# Patient Record
Sex: Male | Born: 1940 | Race: White | Hispanic: No | Marital: Married | Smoking: Never smoker
Health system: Southern US, Community
[De-identification: ages and names within clinical notes are randomized; demographics above are authoritative.]

## PROBLEM LIST (undated history)

## (undated) DIAGNOSIS — Z87442 Personal history of urinary calculi: Secondary | ICD-10-CM

## (undated) DIAGNOSIS — I10 Essential (primary) hypertension: Secondary | ICD-10-CM

## (undated) DIAGNOSIS — R011 Cardiac murmur, unspecified: Secondary | ICD-10-CM

## (undated) DIAGNOSIS — C801 Malignant (primary) neoplasm, unspecified: Secondary | ICD-10-CM

## (undated) DIAGNOSIS — E7800 Pure hypercholesterolemia, unspecified: Secondary | ICD-10-CM

## (undated) DIAGNOSIS — B029 Zoster without complications: Secondary | ICD-10-CM

## (undated) DIAGNOSIS — G40909 Epilepsy, unspecified, not intractable, without status epilepticus: Secondary | ICD-10-CM

## (undated) DIAGNOSIS — M1990 Unspecified osteoarthritis, unspecified site: Secondary | ICD-10-CM

## (undated) DIAGNOSIS — R251 Tremor, unspecified: Secondary | ICD-10-CM

## (undated) HISTORY — PX: FRACTURE SURGERY: SHX138

## (undated) HISTORY — PX: TONSILLECTOMY: SUR1361

## (undated) HISTORY — DX: Essential (primary) hypertension: I10

## (undated) HISTORY — PX: HEEL SPUR SURGERY: SHX665

## (undated) HISTORY — PX: HARDWARE REMOVAL: SHX979

---

## 2013-04-25 DIAGNOSIS — C61 Malignant neoplasm of prostate: Secondary | ICD-10-CM | POA: Insufficient documentation

## 2015-02-23 DIAGNOSIS — E785 Hyperlipidemia, unspecified: Secondary | ICD-10-CM | POA: Insufficient documentation

## 2015-02-23 DIAGNOSIS — R9439 Abnormal result of other cardiovascular function study: Secondary | ICD-10-CM | POA: Insufficient documentation

## 2015-02-23 DIAGNOSIS — I10 Essential (primary) hypertension: Secondary | ICD-10-CM | POA: Insufficient documentation

## 2016-08-30 ENCOUNTER — Encounter: Payer: Self-pay | Admitting: Cardiovascular Disease

## 2017-01-29 DIAGNOSIS — K8020 Calculus of gallbladder without cholecystitis without obstruction: Secondary | ICD-10-CM | POA: Insufficient documentation

## 2020-12-23 ENCOUNTER — Emergency Department
Admission: EM | Admit: 2020-12-23 | Discharge: 2020-12-23 | Disposition: A | Discharge status: Home / Self Care | Payer: Medicare HMO | Attending: Emergency Medicine | Admitting: Emergency Medicine

## 2020-12-23 ENCOUNTER — Other Ambulatory Visit: Payer: Self-pay

## 2020-12-23 DIAGNOSIS — R1031 Right lower quadrant pain: Secondary | ICD-10-CM

## 2020-12-23 DIAGNOSIS — Z8546 Personal history of malignant neoplasm of prostate: Secondary | ICD-10-CM | POA: Insufficient documentation

## 2020-12-23 DIAGNOSIS — K8020 Calculus of gallbladder without cholecystitis without obstruction: Secondary | ICD-10-CM | POA: Diagnosis not present

## 2020-12-23 HISTORY — DX: Unspecified osteoarthritis, unspecified site: M19.90

## 2020-12-23 HISTORY — DX: Malignant (primary) neoplasm, unspecified: C80.1

## 2020-12-23 LAB — CBC
HCT: 38.4 % — ABNORMAL LOW (ref 39.0–52.0)
Hemoglobin: 13.2 g/dL (ref 13.0–17.0)
MCH: 30.8 pg (ref 26.0–34.0)
MCHC: 34.4 g/dL (ref 30.0–36.0)
MCV: 89.5 fL (ref 80.0–100.0)
Platelets: 195 10*3/uL (ref 150–400)
RBC: 4.29 MIL/uL (ref 4.22–5.81)
RDW: 12.7 % (ref 11.5–15.5)
WBC: 7.7 10*3/uL (ref 4.0–10.5)
nRBC: 0 % (ref 0.0–0.2)

## 2020-12-23 LAB — COMPREHENSIVE METABOLIC PANEL
ALT: 9 U/L (ref 0–44)
AST: 16 U/L (ref 15–41)
Albumin: 3.9 g/dL (ref 3.5–5.0)
Alkaline Phosphatase: 81 U/L (ref 38–126)
Anion gap: 9 (ref 5–15)
BUN: 23 mg/dL (ref 8–23)
CO2: 25 mmol/L (ref 22–32)
Calcium: 9 mg/dL (ref 8.9–10.3)
Chloride: 102 mmol/L (ref 98–111)
Creatinine, Ser: 1.16 mg/dL (ref 0.61–1.24)
GFR, Estimated: >60 mL/min (ref >60)
Glucose, Bld: 104 mg/dL — ABNORMAL HIGH (ref 70–99)
Potassium: 3.8 mmol/L (ref 3.5–5.1)
Sodium: 136 mmol/L (ref 135–145)
Total Bilirubin: 1.9 mg/dL — ABNORMAL HIGH (ref 0.3–1.2)
Total Protein: 7.9 g/dL (ref 6.5–8.1)

## 2020-12-23 LAB — URINALYSIS, COMPLETE (UACMP) WITH MICROSCOPIC
Bacteria, UA: NONE SEEN
Bilirubin Urine: NEGATIVE
Glucose, UA: NEGATIVE mg/dL
Hgb urine dipstick: NEGATIVE
Ketones, ur: 5 mg/dL — AB
Leukocytes,Ua: NEGATIVE
Nitrite: NEGATIVE
Protein, ur: 30 mg/dL — AB
Specific Gravity, Urine: 1.026 (ref 1.005–1.030)
pH: 5 (ref 5.0–8.0)

## 2020-12-23 LAB — LIPASE, BLOOD: Lipase: 25 U/L (ref 11–51)

## 2020-12-23 NOTE — ED Notes (Signed)
Urinal provided. Patient verbalized understanding of urine specimen ordered.

## 2020-12-23 NOTE — ED Notes (Signed)
Patient ambulatory to restroom with steady gait.

## 2020-12-23 NOTE — ED Triage Notes (Addendum)
Pt to ER with complaints of abdominal pain. Reports waking up on Tuesday morning around 2am with epigastric pain, nausea and diarrhea. On Wednesday pain started to subside. Now is having a dull- discomfort in his RLQ. Pt was previously diagnosed with gall stones. Pt still has appendix. Nothing makes pain better or worse.   Denies urinary symptoms.   Pt reports he hasn't eaten anything since Monday night. Has been able to tolerate ginger ale.

## 2020-12-23 NOTE — ED Notes (Signed)
Patient is resting comfortably. 

## 2020-12-23 NOTE — ED Notes (Signed)
Patient is resting comfortably. Patient denies needs at this time. Pillow and blanket offered, patient declined.

## 2020-12-23 NOTE — ED Notes (Signed)
ED Provider at bedside. 

## 2020-12-23 NOTE — ED Notes (Signed)
Patient reports dx of gallstones 4 years ago, and reports his urology who dx him stated that as long as they weren't bothering him, they didn't need to treat them. Patient reports nausea, vomiting, and diarrhea since Tuesday, and reports the RLQ pain has worsened today, causing him to come be seen. Patient reports dull ache. Patient reports last emesis was Tuesday, but diarrhea has been frequent today.

## 2020-12-23 NOTE — ED Notes (Signed)
Patient and wife provided discharge instructions, including follow up care. Patient denies needs, questions, or concerns.

## 2020-12-23 NOTE — ED Provider Notes (Signed)
[facility] Emergency Department Provider Note   ____________________________________________   Event Date/Time   First MD Initiated Contact with Patient 12/23/20 1638     (approximate)  I have reviewed the triage vital signs and the nursing notes.   HISTORY  Chief Complaint Abdominal Pain    HPI Curtis Quinn is a 80 y.o. male With the below stated past medical history who presents for right lower quadrant abdominal pain after 3 days of nausea/vomiting/diarrhea and epigastric pain.  Patient states that the symptoms resolved 1 day prior to arrival however he then developed right lower quadrant "discomfort".  Patient describes a dull, aching, 3/10, nonradiating right lower quadrant abdominal pain that is not worse with walking and not associated with any nausea/vomiting/diarrhea.  Patient states that he does have a history of gallstones as well as still has his appendix.  Patient currently denies any vision changes, tinnitus, difficulty speaking, facial droop, sore throat, chest pain, shortness of breath, dysuria, or weakness/numbness/paresthesias in any extremity         Past Medical History:  Diagnosis Date  . Arthritis   . Cancer [facility])    prostate    There are no problems to display for this patient.   Past Surgical History:  Procedure Laterality Date  . FRACTURE SURGERY    . TONSILLECTOMY      Prior to Admission medications   Medication Sig Start Date End Date Taking? Authorizing Provider  simvastatin (ZOCOR) 20 MG tablet Take by mouth.    [provider]    Allergies Penicillins  No family history on file.  Social History Social History   Tobacco Use  . Smoking status: Never Smoker  . Smokeless tobacco: Never Used  Substance Use Topics  . Alcohol use: Yes  . Drug use: Never    Review of Systems Constitutional: No fever/chills Eyes: No visual changes. ENT: No sore throat. Cardiovascular: Denies chest  pain. Respiratory: Denies shortness of breath. Gastrointestinal: Endorses right lower quadrant abdominal pain.  No nausea, no vomiting.  No diarrhea. Genitourinary: Negative for dysuria. Musculoskeletal: Negative for acute arthralgias Skin: Negative for rash. Neurological: Negative for headaches, weakness/numbness/paresthesias in any extremity Psychiatric: Negative for suicidal ideation/homicidal ideation   ____________________________________________   PHYSICAL EXAM:  VITAL SIGNS: ED Triage Vitals  Enc Vitals Group     BP 12/23/20 1343 (!) 121/57     Pulse Rate 12/23/20 1343 93     Resp 12/23/20 1343 17     Temp 12/23/20 1343 98.6 F (37 C)     Temp Source 12/23/20 1343 Oral     SpO2 12/23/20 1343 98 %     Weight 12/23/20 1403 180 lb (81.6 kg)     Height 12/23/20 1403 6\' 1"  (1.854 m)     Head Circumference --      Peak Flow --      Pain Score 12/23/20 1402 3     Pain Loc --      Pain Edu? --      Excl. in [place]? --    Constitutional: Alert and oriented. Well appearing and in no acute distress. Eyes: Conjunctivae are normal. PERRL. Head: Atraumatic. Nose: No congestion/rhinnorhea. Mouth/Throat: Mucous membranes are moist. Neck: No stridor Cardiovascular: Grossly normal heart sounds.  Good peripheral circulation. Respiratory: Normal respiratory effort.  No retractions. Gastrointestinal: Soft and mild right lower quadrant tenderness to palpation without Rovsing sign. No distention. Musculoskeletal: No obvious deformities Neurologic:  Normal speech and language. No gross focal neurologic deficits  are appreciated. Skin:  Skin is warm and dry. No rash noted. Psychiatric: Mood and affect are normal. Speech and behavior are normal.  ____________________________________________   LABS (all labs ordered are listed, but only abnormal results are displayed)  Labs Reviewed  COMPREHENSIVE METABOLIC PANEL - Abnormal; Notable for the following components:      Result Value    Glucose, Bld 104 (*)    Total Bilirubin 1.9 (*)    All other components within normal limits  CBC - Abnormal; Notable for the following components:   HCT 38.4 (*)    All other components within normal limits  LIPASE, BLOOD  URINALYSIS, COMPLETE (UACMP) WITH MICROSCOPIC   ____________________________________________  EKG  ED ECG REPORT I, Naaman Plummer, the attending physician, personally viewed and interpreted this ECG.  Date: 12/23/2020 EKG Time: 1412 Rate: 93 Rhythm: normal sinus rhythm QRS Axis: normal Intervals: normal ST/T Wave abnormalities: normal Narrative Interpretation: no evidence of acute ischemia  PROCEDURES  Procedure(s) performed (including Critical Care):  .1-3 Lead EKG Interpretation Performed by: Naaman Plummer, MD Authorized by: Naaman Plummer, MD     Interpretation: normal     ECG rate:  85   ECG rate assessment: normal     Rhythm: sinus rhythm     Ectopy: none     Conduction: normal       ____________________________________________   INITIAL IMPRESSION / ASSESSMENT AND PLAN / ED COURSE  As part of my medical decision making, I reviewed the following data within the [place] notes reviewed and incorporated, Labs reviewed, EKG interpreted, Old chart reviewed, Radiograph reviewed and Notes from prior ED visits reviewed and incorporated        Patient presents for abdominal pain.  Differential diagnosis includes appendicitis, abdominal aortic aneurysm, surgical biliary disease, pancreatitis, SBO, mesenteric ischemia, serious intra-abdominal bacterial illness, genital torsion. Doubt atypical ACS. Based on history, physical exam, radiologic/laboratory evaluation, there is no red flag results or symptomatology requiring emergent intervention or need for admission at this time Pt tolerating PO. Disposition: Patient will be discharged with strict return precautions and follow up with primary MD within 12-24 hours for  further evaluation. Patient understands that this still may have an early presentation of an emergent medical condition such as appendicitis that will require a recheck.      ____________________________________________   FINAL CLINICAL IMPRESSION(S) / ED DIAGNOSES  Final diagnoses:  Right lower quadrant abdominal pain  Calculus of gallbladder without cholecystitis without obstruction     ED Discharge Orders    None       Note:  This document was prepared using Dragon voice recognition software and may include unintentional dictation errors.   Naaman Plummer, MD 12/23/20 [phone]

## 2021-01-03 ENCOUNTER — Other Ambulatory Visit: Payer: Self-pay

## 2021-01-03 ENCOUNTER — Encounter: Payer: Self-pay | Admitting: Surgery

## 2021-01-03 ENCOUNTER — Other Ambulatory Visit
Admission: RE | Admit: 2021-01-03 | Discharge: 2021-01-03 | Disposition: A | Discharge status: Home / Self Care | Payer: Medicare HMO | Source: Ambulatory Visit | Attending: Surgery | Admitting: Surgery

## 2021-01-03 ENCOUNTER — Ambulatory Visit: Payer: Medicare HMO | Admitting: Surgery

## 2021-01-03 VITALS — BP 141/69 | HR 81 | Temp 98.2°F | Ht 73.0 in | Wt 179.2 lb

## 2021-01-03 DIAGNOSIS — Z711 Person with feared health complaint in whom no diagnosis is made: Secondary | ICD-10-CM

## 2021-01-03 DIAGNOSIS — Z8546 Personal history of malignant neoplasm of prostate: Secondary | ICD-10-CM | POA: Insufficient documentation

## 2021-01-03 DIAGNOSIS — K8050 Calculus of bile duct without cholangitis or cholecystitis without obstruction: Secondary | ICD-10-CM | POA: Insufficient documentation

## 2021-01-03 LAB — COMPREHENSIVE METABOLIC PANEL
ALT: 12 U/L (ref 0–44)
AST: 19 U/L (ref 15–41)
Albumin: 3.9 g/dL (ref 3.5–5.0)
Alkaline Phosphatase: 70 U/L (ref 38–126)
Anion gap: 5 (ref 5–15)
BUN: 24 mg/dL — ABNORMAL HIGH (ref 8–23)
CO2: 26 mmol/L (ref 22–32)
Calcium: 9 mg/dL (ref 8.9–10.3)
Chloride: 106 mmol/L (ref 98–111)
Creatinine, Ser: 0.95 mg/dL (ref 0.61–1.24)
GFR, Estimated: >60 mL/min (ref >60)
Glucose, Bld: 106 mg/dL — ABNORMAL HIGH (ref 70–99)
Potassium: 3.8 mmol/L (ref 3.5–5.1)
Sodium: 137 mmol/L (ref 135–145)
Total Bilirubin: 0.9 mg/dL (ref 0.3–1.2)
Total Protein: 7.6 g/dL (ref 6.5–8.1)

## 2021-01-03 LAB — CBC
HCT: 33.7 % — ABNORMAL LOW (ref 39.0–52.0)
Hemoglobin: 11.6 g/dL — ABNORMAL LOW (ref 13.0–17.0)
MCH: 30.6 pg (ref 26.0–34.0)
MCHC: 34.4 g/dL (ref 30.0–36.0)
MCV: 88.9 fL (ref 80.0–100.0)
Platelets: 257 10*3/uL (ref 150–400)
RBC: 3.79 MIL/uL — ABNORMAL LOW (ref 4.22–5.81)
RDW: 12.3 % (ref 11.5–15.5)
WBC: 5.3 10*3/uL (ref 4.0–10.5)
nRBC: 0 % (ref 0.0–0.2)

## 2021-01-03 LAB — PSA: Prostatic Specific Antigen: 1.67 ng/mL (ref 0.00–4.00)

## 2021-01-03 LAB — PROTIME-INR
INR: 1.2 (ref 0.8–1.2)
Prothrombin Time: 15 seconds (ref 11.4–15.2)

## 2021-01-03 NOTE — Patient Instructions (Addendum)
Your Ultrasound is scheduled for January 04, 2021. Arrive at 7:15am at [facility]. Nothing to eat or drink after midnight tonight.   If you have any concerns or questions, please feel free to call our office. See follow up appointment below.

## 2021-01-03 NOTE — Consult Note (Signed)
Surgical Consultation  01/03/2021  Curtis Quinn is an 80 y.o. male.   Chief Complaint  Patient presents with  . New Patient (Initial Visit)    Gallbladder     HPI: 79 year old male seen in consultation at the request of Dr. Caryl Ada.  Approximately 2 weeks emergency room came in with an episode of right upper quadrant pain that radiated to the epigastric and to the back.,  Pain was moderate and lasted for few hours.  The morning he had a big breakfast at [employer].  He denies any fevers and chills no biliary obstruction.  He states that at some point time he was told that he had gallstones when he was due for his routine CT scan.  There was no evidence performed at the ER.  He did have however a CBC and a CMP that was unremarkable except a slightly elevated bilirubin. He recently moved here to [facility]  Before he was in [place] and [place].  He does have a history of prostate cancer and received cryoablation.  He is not too keen on doctors.  He seems to be able to perform more than 4 METS of activity and denies any chest pains or any heart attacks.  His EKG at that time showed no evidence of ischemia, NSR .  Please note that I have personally reviewed the EKG. No prior abdominal operations. 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 Arthritis   . Cancer [facility])    prostate    Past Surgical History:  Procedure Laterality Date  . FRACTURE SURGERY    . TONSILLECTOMY      History reviewed. No pertinent family history.  Social History:  reports that he has never smoked. He has never used smokeless tobacco. He reports current alcohol use. He reports that he does not use drugs.  Allergies:  Allergies  Allergen Reactions  . Penicillins Other (See Comments)    Hallucinations    Medications reviewed.     ROS Full ROS performed and is otherwise negative other than what is stated in the HPI    BP  (!) 141/69   Pulse 81   Temp 98.2 F (36.8 C) (Oral)   Ht 6\' 1"  (1.854 m)   Wt 179 lb 3.2 oz (81.3 kg)   SpO2 96%   BMI 23.64 kg/m   Physical Exam Vitals and nursing note reviewed. Exam conducted with a chaperone present.  Constitutional:      General: He is not in acute distress.    Appearance: Normal appearance. He is normal weight.  Eyes:     General:        Right eye: No discharge.        Left eye: No discharge.  Neck:     Vascular: No carotid bruit.  Cardiovascular:     Rate and Rhythm: Normal rate and regular rhythm.  Pulmonary:     Effort: Pulmonary effort is normal. No respiratory distress.     Breath sounds: Normal breath sounds. No stridor.  Abdominal:     General: Abdomen is flat. There is no distension.     Palpations: Abdomen is soft. There is no mass.     Tenderness: There is no guarding.     Comments: No peritonitis  Musculoskeletal:     Cervical back: Normal range of motion and neck supple. No rigidity or tenderness.  Lymphadenopathy:     Cervical: No cervical  adenopathy.  Skin:    General: Skin is warm and dry.     Capillary Refill: Capillary refill takes less than 2 seconds.  Neurological:     General: No focal deficit present.     Mental Status: He is alert and oriented to person, place, and time.  Psychiatric:        Mood and Affect: Mood normal.        Behavior: Behavior normal.        Thought Content: Thought content normal.        Judgment: Judgment normal.        Assessment/Plan: This is 80 year old male with presumed biliary colic.  We will confirm diagnosis with ultrasound.  We will also follow-up with a repeat CBC CMP as well as a INR. His symptoms are classic for biliary disease. 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  This is accompanied with Presbyphonia. 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  We will respect  his wishes. I Do think that he will eventually require cholecystectomy.  He specifically asked me about my outcomes was very candid with my response.  I do think that he will be a good candidate for robotic approach I discussed the procedure in detail.  The patient was given Neurosurgeon.  We discussed the risks and benefits of a laparoscopic cholecystectomy and possible cholangiogram including, but not limited to bleeding, infection, injury to surrounding structures such as the intestine or liver, bile leak, retained gallstones, need to convert to an open procedure, prolonged diarrhea, blood clots such as  DVT, common bile duct injury, anesthesia risks, and possible need for additional procedures.  The likelihood of improvement in symptoms and return to the patient's normal status is good. We discussed the typical post-operative recovery course. 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 Please note that I spent over 65 minutes in this encounter including extensive counseling, coordination of his care and extensively reviewing remote records from different institutions    Caroleen Hamman, MD [place] Surgeon

## 2021-01-04 ENCOUNTER — Ambulatory Visit
Admission: RE | Admit: 2021-01-04 | Discharge: 2021-01-04 | Disposition: A | Discharge status: Home / Self Care | Payer: Medicare HMO | Source: Ambulatory Visit | Attending: Surgery | Admitting: Surgery

## 2021-01-04 ENCOUNTER — Other Ambulatory Visit: Payer: Self-pay

## 2021-01-04 ENCOUNTER — Telehealth: Payer: Self-pay | Admitting: Surgery

## 2021-01-04 DIAGNOSIS — Z711 Person with feared health complaint in whom no diagnosis is made: Secondary | ICD-10-CM | POA: Insufficient documentation

## 2021-01-04 DIAGNOSIS — R251 Tremor, unspecified: Secondary | ICD-10-CM | POA: Insufficient documentation

## 2021-01-04 NOTE — Telephone Encounter (Signed)
Patient calls back, he is informed of all dates regarding his surgery and verbalized understanding. 

## 2021-01-04 NOTE — Telephone Encounter (Signed)
Outgoing call is made, left message for patient to call.  Please advise patient of Pre-Admission date/time, COVID Testing date and Surgery date.  Surgery Date: 01/20/21 Preadmission Testing Date: 01/13/21 (phone 8a-1p) Covid Testing Date: Not needed.    Also patient to call at [phone], between 1-3:00pm the day before surgery, to find out what time to arrive for surgery.

## 2021-01-05 ENCOUNTER — Ambulatory Visit: Payer: Medicare HMO | Admitting: Surgery

## 2021-01-05 ENCOUNTER — Other Ambulatory Visit: Payer: Self-pay | Admitting: Family Medicine

## 2021-01-05 ENCOUNTER — Encounter: Payer: Self-pay | Admitting: Surgery

## 2021-01-05 VITALS — BP 131/67 | HR 63 | Temp 98.4°F | Ht 73.0 in | Wt 177.8 lb

## 2021-01-05 DIAGNOSIS — K8020 Calculus of gallbladder without cholecystitis without obstruction: Secondary | ICD-10-CM | POA: Diagnosis not present

## 2021-01-05 DIAGNOSIS — N6019 Diffuse cystic mastopathy of unspecified breast: Secondary | ICD-10-CM

## 2021-01-05 NOTE — Patient Instructions (Signed)
You have requested to have your gallbladder removed. This will be done on 01/20/21 at [facility] with Dr. Dahlia Byes.  You will most likely be out of work 1-2 weeks for this surgery. You will return after your post-op appointment with a lifting restriction for approximately 4 more weeks.  You will be able to eat anything you would like to following surgery. But, start by eating a bland diet and advance this as tolerated. The Gallbladder diet is below, please go as closely by this diet as possible prior to surgery to avoid any further attacks.  Please see the (blue)pre-care form that you have been given today. If you have any questions, please call our office.  Laparoscopic Cholecystectomy Laparoscopic cholecystectomy is surgery to remove the gallbladder. The gallbladder is located in the upper right part of the abdomen, behind the liver. It is a storage sac for bile, which is produced in the liver. Bile aids in the digestion and absorption of fats. Cholecystectomy is often done for inflammation of the gallbladder (cholecystitis). This condition is usually caused by a buildup of gallstones (cholelithiasis) in the gallbladder. Gallstones can block the flow of bile, and that can result in inflammation and pain. In severe cases, emergency surgery may be required. If emergency surgery is not required, you will have time to prepare for the procedure. Laparoscopic surgery is an alternative to open surgery. Laparoscopic surgery has a shorter recovery time. Your common bile duct may also need to be examined during the procedure. If stones are found in the common bile duct, they may be removed. LET [facility] CARE PROVIDER KNOW ABOUT:  Any allergies you have.  All medicines you are taking, including vitamins, herbs, eye drops, creams, and over-the-counter medicines.  Previous problems you or members of your family have had with the use of anesthetics.  Any blood disorders you have.  Previous surgeries  you have had.    Any medical conditions you have. RISKS AND COMPLICATIONS Generally, this is a safe procedure. However, problems may occur, including:  Infection.  Bleeding.  Allergic reactions to medicines.  Damage to other structures or organs.  A stone remaining in the common bile duct.  A bile leak from the cyst duct that is clipped when your gallbladder is removed.  The need to convert to open surgery, which requires a larger incision in the abdomen. This may be necessary if your surgeon thinks that it is not safe to continue with a laparoscopic procedure. BEFORE THE PROCEDURE  Ask your health care provider about:  Changing or stopping your regular medicines. This is especially important if you are taking diabetes medicines or blood thinners.  Taking medicines such as aspirin and ibuprofen. These medicines can thin your blood. Do not take these medicines before your procedure if your health care provider instructs you not to.  Follow instructions from your health care provider about eating or drinking restrictions.  Let your health care provider know if you develop a cold or an infection before surgery.  Plan to have someone take you home after the procedure.  Ask your health care provider how your surgical site will be marked or identified.  You may be given antibiotic medicine to help prevent infection. PROCEDURE  To reduce your risk of infection:  Your health care team will wash or sanitize their hands.  Your skin will be washed with soap.  An IV tube may be inserted into one of your veins.  You will be given a medicine to make  you fall asleep (general anesthetic).  A breathing tube will be placed in your mouth.  The surgeon will make several small cuts (incisions) in your abdomen.  A thin, lighted tube (laparoscope) that has a tiny camera on the end will be inserted through one of the small incisions. The camera on the laparoscope will send a picture to  a TV screen (monitor) in the operating room. This will give the surgeon a good view inside your abdomen.  A gas will be pumped into your abdomen. This will expand your abdomen to give the surgeon more room to perform the surgery.  Other tools that are needed for the procedure will be inserted through the other incisions. The gallbladder will be removed through one of the incisions.  After your gallbladder has been removed, the incisions will be closed with stitches (sutures), staples, or skin glue.  Your incisions may be covered with a bandage (dressing). The procedure may vary among health care providers and hospitals. AFTER THE PROCEDURE  Your blood pressure, heart rate, breathing rate, and blood oxygen level will be monitored often until the medicines you were given have worn off.  You will be given medicines as needed to control your pain.   This information is not intended to replace advice given to you by your health care provider. Make sure you discuss any questions you have with your health care provider.   Document Released: 07/17/2005 Document Revised: 04/07/2015 Document Reviewed: 02/26/2013 Elsevier Interactive Patient Education 2016 [place] Diet for Gallbladder Conditions A low-fat diet can be helpful if you have pancreatitis or a gallbladder condition. With these conditions, your pancreas and gallbladder have trouble digesting fats. A healthy eating plan with less fat will help rest your pancreas and gallbladder and reduce your symptoms. WHAT DO I NEED TO KNOW ABOUT THIS DIET?  Eat a low-fat diet.  Reduce your fat intake to less than 20-30% of your total daily calories. This is less than 50-60 g of fat per day.  Remember that you need some fat in your diet. Ask your dietician what your daily goal should be.  Choose nonfat and low-fat healthy foods. Look for the words "nonfat," "low fat," or "fat free."  As a guide, look on the label and choose foods  with less than 3 g of fat per serving. Eat only one serving.  Avoid alcohol.  Do not smoke. If you need help quitting, talk with your health care provider.  Eat small frequent meals instead of three large heavy meals. WHAT FOODS CAN I EAT? Grains Include healthy grains and starches such as potatoes, wheat bread, fiber-rich cereal, and brown rice. Choose whole grain options whenever possible. In adults, whole grains should account for 45-65% of your daily calories.  Fruits and Vegetables Eat plenty of fruits and vegetables. Fresh fruits and vegetables add fiber to your diet. Meats and Other Protein Sources Eat lean meat such as chicken and pork. Trim any fat off of meat before cooking it. Eggs, fish, and beans are other sources of protein. In adults, these foods should account for 10-35% of your daily calories. Dairy Choose low-fat milk and dairy options. Dairy includes fat and protein, as well as calcium.  Fats and Oils Limit high-fat foods such as fried foods, sweets, baked goods, sugary drinks.  Other Creamy sauces and condiments, such as mayonnaise, can add extra fat. Think about whether or not you need to use them, or use smaller amounts or low fat options.  WHAT FOODS ARE NOT RECOMMENDED?  High fat foods, such as:  [employer].  Ice cream.  [place] toast.  Sweet rolls.  Pizza.  Cheese bread.  Foods covered with batter, butter, creamy sauces, or cheese.  Fried foods.  Sugary drinks and desserts.  Foods that cause gas or bloating   This information is not intended to replace advice given to you by your health care provider. Make sure you discuss any questions you have with your health care provider.   Document Released: 07/22/2013 Document Reviewed: 07/22/2013 Elsevier Interactive Patient Education [employer].

## 2021-01-06 ENCOUNTER — Encounter: Payer: Self-pay | Admitting: Surgery

## 2021-01-06 NOTE — Progress Notes (Signed)
Outpatient Surgical Follow Up  01/06/2021  Curtis Quinn is an 80 y.o. male.   Chief Complaint  Patient presents with   Pre-op Exam    HPI: 80 year old male well-known to me with history of biliary colic that prompted a visit to the emergency room.  I ordered an ultrasound that I personally reviewed showing evidence of cholelithiasis.  CMP and CBC is completely normal.  PSA is slightly elevated.  He is to follow with primary care physician.  He denies any fevers any chills no evidence of biliary obstruction or cholangitis.  No recurrent pains or attacks.  He has been controlling his symptoms with diet. He is again here for surgical discussion.  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  I stated that I respected her decision and I would not think that we will be need for them.  Extensive discussion was held specifically regarding cholecystectomy potential risk and benefits and postoperative course.  Past Medical History:  Diagnosis Date   Arthritis    Cancer [facility])    prostate    Past Surgical History:  Procedure Laterality Date   FRACTURE SURGERY     TONSILLECTOMY      No family history on file.  Social History:  reports that he has never smoked. He has never used smokeless tobacco. He reports current alcohol use. He reports that he does not use drugs.  Allergies:  Allergies  Allergen Reactions   Penicillins Other (See Comments)    Hallucinations    Medications reviewed.    ROS Full ROS performed and is otherwise negative other than what is stated in HPI   BP 131/67   Pulse 63   Temp 98.4 F (36.9 C)   Ht 6\' 1"  (1.854 m)   Wt 177 lb 12.8 oz (80.6 kg)   SpO2 98%   BMI 23.46 kg/m   Physical Exam Vitals and nursing note reviewed. Exam conducted with a chaperone present.  Constitutional:      General: He is not in acute distress.    Appearance: Normal appearance. He is normal weight.  Eyes:     General: No scleral  icterus.       Right eye: No discharge.        Left eye: No discharge.  Cardiovascular:     Rate and Rhythm: Normal rate and regular rhythm.     Heart sounds: No murmur heard. Pulmonary:     Effort: Pulmonary effort is normal. No respiratory distress.     Breath sounds: Normal breath sounds. No stridor. No wheezing.  Abdominal:     General: Abdomen is flat. There is no distension.     Palpations: Abdomen is soft. There is no mass.     Tenderness: There is no abdominal tenderness. There is no guarding or rebound.     Hernia: No hernia is present.  Musculoskeletal:        General: No swelling or tenderness. Normal range of motion.     Cervical back: Normal range of motion and neck supple. No rigidity or tenderness.  Skin:    General: Skin is warm and dry.     Capillary Refill: Capillary refill takes less than 2 seconds.     Coloration: Skin is not jaundiced.  Neurological:     General: No focal deficit present.     Mental Status: He is alert and oriented to person, place, and time.  Psychiatric:  Mood and Affect: Mood normal.       Assessment/Plan: 80 year old male Jehovah's Witness with symptomatic biliary colic and gallstones.  Definitely recommend cholecystectomy.  I do think that he will be a good candidate for robotic approach.  Specifically discussed that I respected his choice for no blood products even if there was any in case of life-threatening event. I discussed the procedure in detail.  The patient was given Neurosurgeon.  We discussed the risks and benefits of a laparoscopic cholecystectomy and possible cholangiogram including, but not limited to bleeding, infection, injury to surrounding structures such as the intestine or liver, bile leak, retained gallstones, need to convert to an open procedure, prolonged diarrhea, blood clots such as  DVT, common bile duct injury, anesthesia risks, and possible need for additional procedures.  The likelihood of  improvement in symptoms and return to the patient's normal status is good. We discussed the typical post-operative recovery course.    Greater than 50% of the 40 minutes  visit was spent in counseling/coordination of care   Caroleen Hamman, MD [facility]

## 2021-01-06 NOTE — H&P (View-Only) (Signed)
Outpatient Surgical Follow Up  01/06/2021  Curtis Quinn is an 80 y.o. male.   Chief Complaint  Patient presents with   Pre-op Exam    HPI: 80 year old male well-known to me with history of biliary colic that prompted a visit to the emergency room.  I ordered an ultrasound that I personally reviewed showing evidence of cholelithiasis.  CMP and CBC is completely normal.  PSA is slightly elevated.  He is to follow with primary care physician.  He denies any fevers any chills no evidence of biliary obstruction or cholangitis.  No recurrent pains or attacks.  He has been controlling his symptoms with diet. He is again here for surgical discussion.  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  I stated that I respected her decision and I would not think that we will be need for them.  Extensive discussion was held specifically regarding cholecystectomy potential risk and benefits and postoperative course.  Past Medical History:  Diagnosis Date   Arthritis    Cancer [facility])    prostate    Past Surgical History:  Procedure Laterality Date   FRACTURE SURGERY     TONSILLECTOMY      No family history on file.  Social History:  reports that he has never smoked. He has never used smokeless tobacco. He reports current alcohol use. He reports that he does not use drugs.  Allergies:  Allergies  Allergen Reactions   Penicillins Other (See Comments)    Hallucinations    Medications reviewed.    ROS Full ROS performed and is otherwise negative other than what is stated in HPI   BP 131/67   Pulse 63   Temp 98.4 F (36.9 C)   Ht 6\' 1"  (1.854 m)   Wt 177 lb 12.8 oz (80.6 kg)   SpO2 98%   BMI 23.46 kg/m   Physical Exam Vitals and nursing note reviewed. Exam conducted with a chaperone present.  Constitutional:      General: He is not in acute distress.    Appearance: Normal appearance. He is normal weight.  Eyes:     General: No scleral  icterus.       Right eye: No discharge.        Left eye: No discharge.  Cardiovascular:     Rate and Rhythm: Normal rate and regular rhythm.     Heart sounds: No murmur heard. Pulmonary:     Effort: Pulmonary effort is normal. No respiratory distress.     Breath sounds: Normal breath sounds. No stridor. No wheezing.  Abdominal:     General: Abdomen is flat. There is no distension.     Palpations: Abdomen is soft. There is no mass.     Tenderness: There is no abdominal tenderness. There is no guarding or rebound.     Hernia: No hernia is present.  Musculoskeletal:        General: No swelling or tenderness. Normal range of motion.     Cervical back: Normal range of motion and neck supple. No rigidity or tenderness.  Skin:    General: Skin is warm and dry.     Capillary Refill: Capillary refill takes less than 2 seconds.     Coloration: Skin is not jaundiced.  Neurological:     General: No focal deficit present.     Mental Status: He is alert and oriented to person, place, and time.  Psychiatric:  Mood and Affect: Mood normal.       Assessment/Plan: 80 year old male Jehov[employer]'s [employer] with symptomatic biliary colic and gallstones.  Definitely recommend cholecystectomy.  I do think that he will be a good candidate for robotic approach.  Specifically discussed that I respected his choice for no blood products even if there was any in case of life-threatening event. I discussed the procedure in detail.  The patient was given Neurosurgeon.  We discussed the risks and benefits of a laparoscopic cholecystectomy and possible cholangiogram including, but not limited to bleeding, infection, injury to surrounding structures such as the intestine or liver, bile leak, retained gallstones, need to convert to an open procedure, prolonged diarrhea, blood clots such as  DVT, common bile duct injury, anesthesia risks, and possible need for additional procedures.  The likelihood of  improvement in symptoms and return to the patient's normal status is good. We discussed the typical post-operative recovery course.    Greater than 50% of the 40 minutes  visit was spent in counseling/coordination of care   Caroleen Hamman, MD [facility]

## 2021-01-11 ENCOUNTER — Ambulatory Visit
Admission: RE | Admit: 2021-01-11 | Discharge: 2021-01-11 | Disposition: A | Discharge status: Home / Self Care | Payer: Medicare HMO | Source: Ambulatory Visit | Attending: Family Medicine | Admitting: Family Medicine

## 2021-01-11 ENCOUNTER — Other Ambulatory Visit: Payer: Self-pay

## 2021-01-11 DIAGNOSIS — N62 Hypertrophy of breast: Secondary | ICD-10-CM | POA: Diagnosis not present

## 2021-01-11 DIAGNOSIS — N6019 Diffuse cystic mastopathy of unspecified breast: Secondary | ICD-10-CM

## 2021-01-13 ENCOUNTER — Encounter
Admission: RE | Admit: 2021-01-13 | Discharge: 2021-01-13 | Disposition: A | Discharge status: Home / Self Care | Payer: Medicare HMO | Source: Ambulatory Visit | Attending: Surgery | Admitting: Surgery

## 2021-01-13 ENCOUNTER — Other Ambulatory Visit: Payer: Self-pay

## 2021-01-13 HISTORY — DX: Tremor, unspecified: R25.1

## 2021-01-13 HISTORY — DX: Personal history of urinary calculi: Z87.442

## 2021-01-13 HISTORY — DX: Epilepsy, unspecified, not intractable, without status epilepticus: G40.909

## 2021-01-13 HISTORY — DX: Cardiac murmur, unspecified: R01.1

## 2021-01-13 HISTORY — DX: Zoster without complications: B02.9

## 2021-01-13 HISTORY — DX: Pure hypercholesterolemia, unspecified: E78.00

## 2021-01-13 NOTE — Patient Instructions (Addendum)
Your procedure is scheduled on:01-20-21 THURSDAY Report to the Registration Desk on the 1st floor of the [facility]-Then proceed to the 2nd floor Surgery Desk in the [facility] To find out your arrival time, please call [phone] between 1PM - 3PM on:01-19-21 WEDNESDAY  REMEMBER: Instructions that are not followed completely may result in serious medical risk, up to and including death; or upon the discretion of your surgeon and anesthesiologist your surgery may need to be rescheduled.  Do not eat food after midnight the night before surgery.  No gum chewing, lozengers or hard candies.  You may however, drink CLEAR liquids up to 2 hours before you are scheduled to arrive for your surgery. Do not drink anything within 2 hours of your scheduled arrival time.  Clear liquids include: - water  - apple juice without pulp - gatorade - black coffee or tea (Do NOT add milk or creamers to the coffee or tea) Do NOT drink anything that is not on this list.  DO NOT TAKE ANY MEDICATION THE DAY OF SURGERY  One week prior to surgery: Stop Anti-inflammatories (NSAIDS) such as Advil, Aleve, Ibuprofen, Motrin, Naproxen, Naprosyn and Aspirin based products such as Excedrin, Goodys Powder, BC Powder.You may however, continue to take Tylenol if needed for pain up until the day of surgery.  Stop ANY OVER THE COUNTER supplements until after surgery. You may however, continue to take Tylenol if needed for pain up until the day of surgery.  No Alcohol for 24 hours before or after surgery.  No Smoking including e-cigarettes for 24 hours prior to surgery.  No chewable tobacco products for at least 6 hours prior to surgery.  No nicotine patches on the day of surgery.  Do not use any "recreational" drugs for at least a week prior to your surgery.  Please be advised that the combination of cocaine and anesthesia may have negative outcomes, up to and including death. If you test positive for cocaine, your  surgery will be cancelled.  On the morning of surgery brush your teeth with toothpaste and water, you may rinse your mouth with mouthwash if you wish. Do not swallow any toothpaste or mouthwash.  Do not wear jewelry, make-up, hairpins, clips or nail polish.  Do not wear lotions, powders, or perfumes.   Do not shave body from the neck down 48 hours prior to surgery just in case you cut yourself which could leave a site for infection.  Also, freshly shaved skin may become irritated if using the CHG soap.  Contact lenses, hearing aids and dentures may not be worn into surgery.  Do not bring valuables to the hospital. [facility] is not responsible for any missing/lost belongings or valuables.   Use CHG Soap as directed on instruction sheet  Notify your doctor if there is any change in your medical condition (cold, fever, infection).  Wear comfortable clothing (specific to your surgery type) to the hospital.  After surgery, you can help prevent lung complications by doing breathing exercises.  Take deep breaths and cough every 1-2 hours. Your doctor may order a device called an Incentive Spirometer to help you take deep breaths. When coughing or sneezing, hold a pillow firmly against your incision with both hands. This is called "splinting." Doing this helps protect your incision. It also decreases belly discomfort.  If you are being admitted to the hospital overnight, leave your suitcase in the car. After surgery it may be brought to your room.  If you are  being discharged the day of surgery, you will not be allowed to drive home. You will need a responsible adult (18 years or older) to drive you home and stay with you that night.   If you are taking public transportation, you will need to have a responsible adult (18 years or older) with you. Please confirm with your physician that it is acceptable to use public transportation.   Please call the [facility] at [phone] if you have any questions about these instructions.  Surgery Visitation Policy:  Patients undergoing a surgery or procedure may have one family member or support person with them as long as that person is not COVID-19 positive or experiencing its symptoms.  That person may remain in the waiting area during the procedure.  Inpatient Visitation:    Visiting hours are 7 a.m. to 8 p.m. Inpatients will be allowed two visitors daily. The visitors may change each day during the patient's stay. No visitors under the age of 29. Any visitor under the age of 48 must be accompanied by an adult. The visitor must pass COVID-19 screenings, use hand sanitizer when entering and exiting the patient's room and wear a mask at all times, including in the patient's room. Patients must also wear a mask when staff or their visitor are in the room. Masking is required regardless of vaccination status.

## 2021-01-14 ENCOUNTER — Ambulatory Visit: Payer: Medicare HMO | Admitting: Urology

## 2021-01-14 ENCOUNTER — Encounter: Payer: Self-pay | Admitting: Urology

## 2021-01-14 VITALS — BP 131/80 | HR 89 | Ht 72.0 in | Wt 175.0 lb

## 2021-01-14 DIAGNOSIS — C61 Malignant neoplasm of prostate: Secondary | ICD-10-CM | POA: Diagnosis not present

## 2021-01-14 NOTE — Progress Notes (Signed)
01/14/2021 1:22 PM   Curtis Quinn 04-06-1941 [id number]  Referring provider: [name], MD [phone] [place],  [place] [address]  Chief Complaint  Patient presents with   Elevated PSA    HPI: Curtis Quinn is a 80 y.o. male referred for evaluation of an elevated PSA.  Recently moved to the area from [place] He brought records from his prior urologist which were copied, scanned into the chart and reviewed Diagnosed with prostate cancer June 2018 with a PSA of 73.  11/12 cores positive prostate biopsy with Gleason 3+3/3+4  MRI showed a 4.5 cm PI-RADS 5 lesion right prostate with extracapsular extension and involvement of the right seminal vesicle Declined radiation therapy Treated with targeted cryoablation with high-dose bicalutamide and [place] x2 years He last saw his urologist in [place] November 2021 and PSA was undetectable at <0.04.  Had been off ADT for 6 months at the time of this visit At recent PCP visit his PSA was detectable at 1.67 on 01/03/2021 No bothersome LUTS Denies dysuria, gross hematuria  PMH: Past Medical History:  Diagnosis Date   Arthritis    Cancer ([facility])    prostate   Epilepsy ([facility])    as a child only   Heart murmur    History of kidney stones    Hypercholesteremia    Shingles    Tremor     Surgical History: Past Surgical History:  Procedure Laterality Date   FRACTURE SURGERY     bil heels-plates and screws   HARDWARE REMOVAL Right    foot   TONSILLECTOMY      Home Medications:  Allergies as of 01/14/2021       Reactions   Penicillins Other (See Comments)   Hallucinations        Medication List        Accurate as of January 14, 2021  1:22 PM. If you have any questions, ask your nurse or doctor.          simvastatin 20 MG tablet Commonly known as: ZOCOR Take 20 mg by mouth at bedtime.        Allergies:  Allergies  Allergen Reactions   Penicillins Other (See Comments)    Hallucinations    Family  History: No family history on file.  Social History:  reports that he has never smoked. He has never used smokeless tobacco. He reports current alcohol use. He reports that he does not use drugs.   Physical Exam: BP 131/80   Pulse 89   Ht 6' (1.829 m)   Wt 175 lb (79.4 kg)   BMI 23.73 kg/m   Constitutional:  Alert and oriented, No acute distress. HEENT: [place] AT, moist mucus membranes.  Trachea midline, no masses. Cardiovascular: No clubbing, cyanosis, or edema. Respiratory: Normal respiratory effort, no increased work of breathing. GI: Abdomen is soft, nontender, nondistended, no abdominal masses GU: Prostate small, contracted.  No nodules Lymph: No cervical or inguinal lymphadenopathy. Skin: No rashes, bruises or suspicious lesions. Neurologic: Grossly intact, no focal deficits, moving all 4 extremities. Psychiatric: Normal mood and affect.    Assessment & Plan:    1.  Prostate cancer Status post targeted cryoablation plus ADT x2 years PSA was undetectable November 2021 off ADT x6 months Recent PSA now detectable at 1.67 I recommended a PET/CT (PMSA) to assess for metastatic disease He will be contacted with results and further recommendations  I spent 50 total minutes on the day of the encounter including pre-visit review of  the medical record, face-to-face time with the patient, and post visit ordering of labs/imaging/tests.   Abbie Sons, [place] [address], [place], [place] [address] [phone]

## 2021-01-19 MED ORDER — CHLORHEXIDINE GLUCONATE CLOTH 2 % EX PADS
6.0000 | MEDICATED_PAD | Freq: Once | CUTANEOUS | Status: AC
  Administered 2021-01-20: 6 via TOPICAL

## 2021-01-19 MED ORDER — ACETAMINOPHEN 500 MG PO TABS: 1000.0000 mg | ORAL_TABLET | ORAL | Status: AC

## 2021-01-19 MED ORDER — GABAPENTIN 300 MG PO CAPS
300.0000 mg | ORAL_CAPSULE | ORAL | Status: AC
Start: 2021-01-20 — End: 2021-01-20

## 2021-01-19 MED ORDER — ORAL CARE MOUTH RINSE: 15.0000 mL | Freq: Once | OROMUCOSAL | Status: AC

## 2021-01-19 MED ORDER — CEFAZOLIN SODIUM-DEXTROSE 2-4 GM/100ML-% IV SOLN
2.0000 g | INTRAVENOUS | Status: AC
  Administered 2021-01-20: 2 g via INTRAVENOUS

## 2021-01-19 MED ORDER — CHLORHEXIDINE GLUCONATE 0.12 % MT SOLN: 15.0000 mL | Freq: Once | OROMUCOSAL | Status: AC

## 2021-01-19 MED ORDER — LACTATED RINGERS IV SOLN: INTRAVENOUS | Status: DC

## 2021-01-19 MED ORDER — CHLORHEXIDINE GLUCONATE CLOTH 2 % EX PADS
6.0000 | MEDICATED_PAD | Freq: Once | CUTANEOUS | Status: DC
Start: 2021-01-19 — End: 2021-01-20

## 2021-01-19 MED ORDER — CELECOXIB 200 MG PO CAPS: 200.0000 mg | ORAL_CAPSULE | ORAL | Status: AC

## 2021-01-19 MED ORDER — FAMOTIDINE 20 MG PO TABS: 20.0000 mg | ORAL_TABLET | Freq: Once | ORAL | Status: AC

## 2021-01-19 MED ORDER — INDOCYANINE GREEN 25 MG IV SOLR
2.5000 mg | Freq: Once | INTRAVENOUS | Status: AC
  Administered 2021-01-20: 2.5 mg via INTRAVENOUS
  Filled 2021-01-19: qty 1

## 2021-01-20 ENCOUNTER — Encounter
Admission: RE | Disposition: A | Discharge status: Home / Self Care | Payer: Self-pay | Source: Home / Self Care | Attending: Surgery

## 2021-01-20 ENCOUNTER — Encounter: Payer: Self-pay | Admitting: Surgery

## 2021-01-20 ENCOUNTER — Ambulatory Visit: Payer: Medicare HMO | Admitting: Certified Registered"

## 2021-01-20 ENCOUNTER — Ambulatory Visit
Admission: RE | Admit: 2021-01-20 | Discharge: 2021-01-20 | Disposition: A | Discharge status: Home / Self Care | Payer: Medicare HMO | Attending: Surgery | Admitting: Surgery

## 2021-01-20 ENCOUNTER — Telehealth: Payer: Self-pay | Admitting: Cardiovascular Disease

## 2021-01-20 ENCOUNTER — Other Ambulatory Visit: Payer: Self-pay

## 2021-01-20 DIAGNOSIS — Z789 Other specified health status: Secondary | ICD-10-CM | POA: Diagnosis not present

## 2021-01-20 DIAGNOSIS — Z880 Allergy status to penicillin: Secondary | ICD-10-CM | POA: Insufficient documentation

## 2021-01-20 DIAGNOSIS — Z9181 History of falling: Secondary | ICD-10-CM | POA: Insufficient documentation

## 2021-01-20 DIAGNOSIS — K8010 Calculus of gallbladder with chronic cholecystitis without obstruction: Secondary | ICD-10-CM | POA: Insufficient documentation

## 2021-01-20 DIAGNOSIS — Z8546 Personal history of malignant neoplasm of prostate: Secondary | ICD-10-CM | POA: Insufficient documentation

## 2021-01-20 DIAGNOSIS — I4892 Unspecified atrial flutter: Secondary | ICD-10-CM

## 2021-01-20 DIAGNOSIS — C61 Malignant neoplasm of prostate: Secondary | ICD-10-CM | POA: Diagnosis not present

## 2021-01-20 DIAGNOSIS — K8050 Calculus of bile duct without cholangitis or cholecystitis without obstruction: Secondary | ICD-10-CM | POA: Diagnosis not present

## 2021-01-20 DIAGNOSIS — Z79899 Other long term (current) drug therapy: Secondary | ICD-10-CM | POA: Insufficient documentation

## 2021-01-20 SURGERY — CHOLECYSTECTOMY, ROBOT-ASSISTED, LAPAROSCOPIC
Anesthesia: General

## 2021-01-20 MED ORDER — PROPOFOL 10 MG/ML IV BOLUS
INTRAVENOUS | Status: DC | PRN
  Administered 2021-01-20: 160 mg via INTRAVENOUS

## 2021-01-20 MED ORDER — FENTANYL CITRATE (PF) 100 MCG/2ML IJ SOLN
INTRAMUSCULAR | Status: AC
  Filled 2021-01-20: qty 2

## 2021-01-20 MED ORDER — ROCURONIUM BROMIDE 100 MG/10ML IV SOLN
INTRAVENOUS | Status: DC | PRN
  Administered 2021-01-20: 50 mg via INTRAVENOUS

## 2021-01-20 MED ORDER — LIDOCAINE HCL (CARDIAC) PF 100 MG/5ML IV SOSY
PREFILLED_SYRINGE | INTRAVENOUS | Status: DC | PRN
  Administered 2021-01-20: 100 mg via INTRAVENOUS

## 2021-01-20 MED ORDER — BUPIVACAINE LIPOSOME 1.3 % IJ SUSP
INTRAMUSCULAR | Status: DC | PRN
  Administered 2021-01-20: 20 mL

## 2021-01-20 MED ORDER — FENTANYL CITRATE (PF) 100 MCG/2ML IJ SOLN
INTRAMUSCULAR | Status: DC | PRN
  Administered 2021-01-20: 100 ug via INTRAVENOUS
  Administered 2021-01-20 (×2): 50 ug via INTRAVENOUS

## 2021-01-20 MED ORDER — PHENYLEPHRINE HCL (PRESSORS) 10 MG/ML IV SOLN
INTRAVENOUS | Status: AC
  Filled 2021-01-20: qty 1

## 2021-01-20 MED ORDER — FENTANYL CITRATE (PF) 100 MCG/2ML IJ SOLN
25.0000 ug | INTRAMUSCULAR | Status: DC | PRN
Start: 2021-01-20 — End: 2021-01-20

## 2021-01-20 MED ORDER — CHLORHEXIDINE GLUCONATE 0.12 % MT SOLN
OROMUCOSAL | Status: AC
  Administered 2021-01-20: 15 mL via OROMUCOSAL
  Filled 2021-01-20: qty 15

## 2021-01-20 MED ORDER — ONDANSETRON HCL 4 MG/2ML IJ SOLN
INTRAMUSCULAR | Status: DC | PRN
  Administered 2021-01-20 (×2): 4 mg via INTRAVENOUS

## 2021-01-20 MED ORDER — CEFAZOLIN SODIUM-DEXTROSE 2-4 GM/100ML-% IV SOLN
INTRAVENOUS | Status: AC
  Filled 2021-01-20: qty 100

## 2021-01-20 MED ORDER — GLYCOPYRROLATE 0.2 MG/ML IJ SOLN
INTRAMUSCULAR | Status: DC | PRN
  Administered 2021-01-20: .2 mg via INTRAVENOUS

## 2021-01-20 MED ORDER — FAMOTIDINE 20 MG PO TABS
ORAL_TABLET | ORAL | Status: AC
  Administered 2021-01-20: 20 mg via ORAL
  Filled 2021-01-20: qty 1

## 2021-01-20 MED ORDER — SODIUM CHLORIDE 0.9 % IR SOLN
Status: DC | PRN
  Administered 2021-01-20: 100 mL

## 2021-01-20 MED ORDER — ONDANSETRON HCL 4 MG/2ML IJ SOLN: 4.0000 mg | Freq: Once | INTRAMUSCULAR | Status: DC | PRN

## 2021-01-20 MED ORDER — CELECOXIB 200 MG PO CAPS
ORAL_CAPSULE | ORAL | Status: AC
  Administered 2021-01-20: 200 mg via ORAL
  Filled 2021-01-20: qty 1

## 2021-01-20 MED ORDER — ACETAMINOPHEN 500 MG PO TABS
ORAL_TABLET | ORAL | Status: AC
  Administered 2021-01-20: 1000 mg via ORAL
  Filled 2021-01-20: qty 2

## 2021-01-20 MED ORDER — HYDROCODONE-ACETAMINOPHEN 5-325 MG PO TABS
1.0000 | ORAL_TABLET | ORAL | 0 refills | Status: DC | PRN
Start: 2021-01-20 — End: 2021-02-09

## 2021-01-20 MED ORDER — BUPIVACAINE-EPINEPHRINE (PF) 0.25% -1:200000 IJ SOLN
INTRAMUSCULAR | Status: AC
  Filled 2021-01-20: qty 30

## 2021-01-20 MED ORDER — BUPIVACAINE-EPINEPHRINE (PF) 0.25% -1:200000 IJ SOLN
INTRAMUSCULAR | Status: DC | PRN
  Administered 2021-01-20: 30 mL via PERINEURAL

## 2021-01-20 MED ORDER — DEXAMETHASONE SODIUM PHOSPHATE 10 MG/ML IJ SOLN
INTRAMUSCULAR | Status: DC | PRN
  Administered 2021-01-20: 10 mg via INTRAVENOUS

## 2021-01-20 MED ORDER — SODIUM CHLORIDE FLUSH 0.9 % IV SOLN
INTRAVENOUS | Status: AC
  Filled 2021-01-20: qty 10

## 2021-01-20 MED ORDER — SUGAMMADEX SODIUM 500 MG/5ML IV SOLN
INTRAVENOUS | Status: DC | PRN
  Administered 2021-01-20: 400 mg via INTRAVENOUS

## 2021-01-20 MED ORDER — BUPIVACAINE LIPOSOME 1.3 % IJ SUSP
INTRAMUSCULAR | Status: AC
  Filled 2021-01-20: qty 20

## 2021-01-20 MED ORDER — GABAPENTIN 300 MG PO CAPS
ORAL_CAPSULE | ORAL | Status: AC
  Administered 2021-01-20: 300 mg via ORAL
  Filled 2021-01-20: qty 1

## 2021-01-20 SURGICAL SUPPLY — 52 items
ADH SKN CLS APL DERMABOND .7 (GAUZE/BANDAGES/DRESSINGS) ×1
APL PRP STRL LF DISP 70% ISPRP (MISCELLANEOUS) ×1
BAG SPEC RTRVL LRG 6X4 10 (ENDOMECHANICALS) ×1
CANNULA REDUC XI 12-8 STAPL (CANNULA) ×1
CANNULA REDUCER 12-8 DVNC XI (CANNULA) ×1 IMPLANT
CHLORAPREP W/TINT 26 (MISCELLANEOUS) ×2 IMPLANT
CLIP VESOLOCK MED LG 6/CT (CLIP) ×2 IMPLANT
COVER WAND RF STERILE (DRAPES) ×2 IMPLANT
DECANTER SPIKE VIAL GLASS SM (MISCELLANEOUS) ×2 IMPLANT
DEFOGGER SCOPE WARMER CLEARIFY (MISCELLANEOUS) ×2 IMPLANT
DERMABOND ADVANCED (GAUZE/BANDAGES/DRESSINGS) ×1
DERMABOND ADVANCED .7 DNX12 (GAUZE/BANDAGES/DRESSINGS) ×1 IMPLANT
DRAPE ARM DVNC X/XI (DISPOSABLE) ×4 IMPLANT
DRAPE COLUMN DVNC XI (DISPOSABLE) ×1 IMPLANT
DRAPE DA VINCI XI ARM (DISPOSABLE) ×4
DRAPE DA VINCI XI COLUMN (DISPOSABLE) ×1
ELECT CAUTERY BLADE 6.4 (BLADE) ×2 IMPLANT
ELECT REM PT RETURN 9FT ADLT (ELECTROSURGICAL) ×2
ELECTRODE REM PT RTRN 9FT ADLT (ELECTROSURGICAL) ×1 IMPLANT
GLOVE SURG ENC MOIS LTX SZ7 (GLOVE) ×4 IMPLANT
GOWN STRL REUS W/ TWL LRG LVL3 (GOWN DISPOSABLE) ×4 IMPLANT
GOWN STRL REUS W/TWL LRG LVL3 (GOWN DISPOSABLE) ×8
IRRIGATION STRYKERFLOW (MISCELLANEOUS) ×1 IMPLANT
IRRIGATOR STRYKERFLOW (MISCELLANEOUS) ×2
IV NS 1000ML (IV SOLUTION) ×2
IV NS 1000ML BAXH (IV SOLUTION) ×1 IMPLANT
KIT PINK PAD W/HEAD ARE REST (MISCELLANEOUS) ×2
KIT PINK PAD W/HEAD ARM REST (MISCELLANEOUS) ×1 IMPLANT
LABEL OR SOLS (LABEL) ×2 IMPLANT
MANIFOLD NEPTUNE II (INSTRUMENTS) IMPLANT
NEEDLE HYPO 22GX1.5 SAFETY (NEEDLE) ×2 IMPLANT
NS IRRIG 500ML POUR BTL (IV SOLUTION) ×2 IMPLANT
OBTURATOR OPTICAL STANDARD 8MM (TROCAR) ×1
OBTURATOR OPTICAL STND 8 DVNC (TROCAR) ×1
OBTURATOR OPTICALSTD 8 DVNC (TROCAR) ×1 IMPLANT
PACK LAP CHOLECYSTECTOMY (MISCELLANEOUS) ×2 IMPLANT
PENCIL ELECTRO HAND CTR (MISCELLANEOUS) ×2 IMPLANT
POUCH SPECIMEN RETRIEVAL 10MM (ENDOMECHANICALS) ×2 IMPLANT
SEAL CANN UNIV 5-8 DVNC XI (MISCELLANEOUS) ×3 IMPLANT
SEAL XI 5MM-8MM UNIVERSAL (MISCELLANEOUS) ×3
SET TUBE SMOKE EVAC HIGH FLOW (TUBING) ×2 IMPLANT
SOLUTION ELECTROLUBE (MISCELLANEOUS) ×2 IMPLANT
SPONGE T-LAP 18X18 ~~LOC~~+RFID (SPONGE) ×2 IMPLANT
SPONGE T-LAP 4X18 ~~LOC~~+RFID (SPONGE) IMPLANT
STAPLER CANNULA SEAL DVNC XI (STAPLE) ×1 IMPLANT
STAPLER CANNULA SEAL XI (STAPLE) ×1
SUT MNCRL AB 4-0 PS2 18 (SUTURE) ×2 IMPLANT
SUT VICRYL 0 AB UR-6 (SUTURE) ×4 IMPLANT
SYR 20ML LL LF (SYRINGE) ×2 IMPLANT
SYR 30ML LL (SYRINGE) ×2 IMPLANT
TAPE TRANSPORE STRL 2 31045 (GAUZE/BANDAGES/DRESSINGS) ×2 IMPLANT
TROCAR BALLN GELPORT 12X130M (ENDOMECHANICALS) ×2 IMPLANT

## 2021-01-20 NOTE — Anesthesia Preprocedure Evaluation (Signed)
Anesthesia Evaluation  Patient identified by MRN, date of birth, ID band Patient awake    Reviewed: Allergy & Precautions, NPO status , Patient's Chart, lab work & pertinent test results  History of Anesthesia Complications Negative for: history of anesthetic complications  Airway Mallampati: II       Dental   Pulmonary neg sleep apnea, neg COPD, Not current smoker,           Cardiovascular (-) hypertension(-) Past MI and (-) CHF (-) dysrhythmias + Valvular Problems/Murmurs      Neuro/Psych Seizures - (none since a child),     GI/Hepatic Neg liver ROS, neg GERD  ,  Endo/Other  neg diabetes  Renal/GU negative Renal ROS     Musculoskeletal   Abdominal   Peds  Hematology   Anesthesia Other Findings   Reproductive/Obstetrics                             Anesthesia Physical Anesthesia Plan  ASA: 2  Anesthesia Plan: General   Post-op Pain Management:    Induction: Intravenous  PONV Risk Score and Plan: 2  Airway Management Planned: Oral ETT  Additional Equipment:   Intra-op Plan:   Post-operative Plan:   Informed Consent: I have reviewed the patients History and Physical, chart, labs and discussed the procedure including the risks, benefits and alternatives for the proposed anesthesia with the patient or authorized representative who has indicated his/her understanding and acceptance.       Plan Discussed with:   Anesthesia Plan Comments:         Anesthesia Quick Evaluation

## 2021-01-20 NOTE — Consult Note (Signed)
Cardiology Consultation:   Patient ID: Curtis Quinn MRN: [id number]; DOB: Oct 17, 1940  Admit date: 01/20/2021 Date of Consult: 01/20/2021  PCP:  [name], MD   [facility] Providers Cardiologist: [name]-[facility] Physician requesting consult: Dr. [name] Reason for consult: Atrial fib flutter, postop   Patient Profile:   Curtis Quinn is a 80 y.o. male with a hx of prostate cancer biliary colic who is being seen 01/20/2021 for the evaluation of postoperative atrial flutter  History of Present Illness:   Mr. Curtis Quinn had EKG dated May 27 noted to be normal sinus rhythm Presented for surgery today with Dr. [name], robotic assisted lap chole, uncomplicated On telemetry postoperatively noted to be in atrial flutter, rates in the 60 range, asymptomatic On further discussion he is Jehovah's Witness, there is concern of his prostate cancer status, ongoing testing pending  Review of prior records, recently moved from [place], No echocardiogram on file, only on simvastatin as outpatient  He has seen urology, PET scan has been ordered  Long discussion with his wife, prior history of falls, Jehovah's Witness, certainly does not want blood products Has had fatigue recently, follow-up with the cancer Likes to walk the dog  Past Medical History:  Diagnosis Date   Arthritis    Cancer ([place])    prostate   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 Authorizing Provider  HYDROcodone-acetaminophen (NORCO/VICODIN) 5-325 MG tablet Take 1-2 tablets by mouth every 4 (four) hours as needed for moderate pain. 01/20/21  Yes [name], [name], MD   simvastatin (ZOCOR) 20 MG tablet Take 20 mg by mouth at bedtime.   Yes [provider]    Inpatient Medications: Scheduled Meds:  Chlorhexidine Gluconate Cloth  6 each Topical Once   sodium chloride flush       Continuous Infusions:  lactated ringers 10 mL/hr at 01/20/21 0813   PRN Meds: fentaNYL (SUBLIMAZE) injection, ondansetron (ZOFRAN) IV  Allergies:    Allergies  Allergen Reactions   Penicillins Other (See Comments)    Hallucinations    Social History:   Social History   Socioeconomic History   Marital status: Married    Spouse name: Not on file   Number of children: Not on file   Years of education: Not on file   Highest education level: Not on file  Occupational History   Not on file  Tobacco Use   Smoking status: Never   Smokeless tobacco: Never  Vaping Use   Vaping Use: Never used  Substance and Sexual Activity   Alcohol use: Yes    Comment: occ wine 2-3 times monthly   Drug use: Never   Sexual activity: Not on file  Other Topics Concern   Not on file  Social History Narrative   Not on file   Social Determinants of Health   Financial Resource Strain: Not on file  Food Insecurity: Not on file  Transportation Needs: Not on file  Physical Activity: Not on file  Stress: Not on file  Social Connections: Not on file  Intimate Partner Violence: Not on file    Family History:   History reviewed. No pertinent family history.   ROS:  Please see the history of present illness.  Review of Systems  Constitutional: Negative.   HENT: Negative.    Respiratory: Negative.    Cardiovascular: Negative.   Gastrointestinal: Negative.   Musculoskeletal: Negative.   Neurological: Negative.   Psychiatric/Behavioral: Negative.    All other systems reviewed and are negative.   Physical Exam/Data:   Vitals:   01/20/21 1050 01/20/21 1100 01/20/21 1115 01/20/21 1133  BP:  (!) 134/55 (!) 127/47 (!) 142/53  Pulse: 64 (!) 58 (!) 50 62  Resp: 15 14 14  20   Temp:   97.6 F (36.4 C) 98.1 F (36.7 C)  TempSrc:    Temporal  SpO2: 98% 95% 94% 94%  Weight:      Height:        Intake/Output Summary (Last 24 hours) at 01/20/2021 1155 Last data filed at 01/20/2021 1100 Gross per 24 hour  Intake 1200 ml  Output --  Net 1200 ml   Last 3 Weights 01/20/2021 01/14/2021 01/05/2021  Weight (lbs) 175 lb 175 lb 177 lb 12.8 oz  Weight (kg) 79.379 kg 79.379 kg 80.65 kg     Body mass index is 23.73 kg/m.  General:  Well nourished, well developed, in no acute distress HEENT: normal Lymph: no adenopathy Neck: no JVD Endocrine:  No thryomegaly Vascular: No carotid bruits; FA pulses 2+ bilaterally without bruits  Cardiac: Irregularly irregular,  no murmur  Lungs:  clear to auscultation bilaterally, no wheezing, rhonchi or rales  Abd: soft, nontender, no hepatomegaly  Ext: no edema Musculoskeletal:  No deformities, BUE and BLE strength normal and equal Skin: warm and dry  Neuro:  CNs 2-12 intact, no focal abnormalities noted Psych:  Normal affect   EKG:  The EKG was personally reviewed and demonstrates: Atrial flutter, no significant ST-T wave changes In comparison prior EKG showing normal sinus rhythm from Dec 24, 2020  Telemetry:  Telemetry was personally reviewed and demonstrates: Atrial flutter  Relevant CV Studies: No recent echocardiogram  Laboratory Data:  High Sensitivity Troponin:  No results for input(s): TROPONINIHS in the last 720 hours.   ChemistryNo results for input(s): NA, K, CL, CO2, GLUCOSE, BUN, CREATININE, CALCIUM, GFRNONAA, GFRAA, ANIONGAP in the last 168 hours.  No results for input(s): PROT, ALBUMIN, AST, ALT, ALKPHOS, BILITOT in the last 168 hours. HematologyNo results for input(s): WBC, RBC, HGB, HCT, MCV, MCH, MCHC, RDW, PLT in the last 168 hours. BNPNo results for input(s): BNP, PROBNP in the last 168 hours.  DDimer No results for input(s): DDIMER in the last 168 hours.   Radiology/Studies:  No results  found.   Assessment and Plan:   Atrial flutter Timing of onset unclear, Prior EKG May 27 normal sinus rhythm, EKG today June 23 atrial flutter Certainly possible he converted to atrial flutter in the past month, Unable to exclude atrial flutter developing during surgery or postop Appears to be asymptomatic, rate controlled on no medications Given postoperative state, no anticoagulation restarted Surgical team aware Very challenging situation, wife uncertain if she would like anticoagulation at all as they are Jehovah's Witness and given his prostate cancer and unclear diagnosis/concern for progression, she needs additional time to think about treatment options -We have recommended we set up a meeting in the clinic for further discussion  Robotic assisted laparoscopic cholecystectomy  Discharge from the hospital today, Not a good candidate for anticoagulation  at this time, will discuss in follow-up in clinic Management per Dr. Dahlia Byes  Prostate cancer Followed by Dr. Bernardo Heater Has PET scan pending   Long discussion with patient and patient's wife in the waiting room concerning atrial fibrillation/flutter, management, options, complications being a Jehovah's Witness  Total encounter time more than 110 minutes  Greater than 50% was spent in counseling and coordination of care with the patient    For questions or updates, please contact [place] Please consult [email or web address] for contact info under    Signed, Ida Rogue, MD  01/20/2021 11:55 AM

## 2021-01-20 NOTE — Interval H&P Note (Signed)
History and Physical Interval Note:  01/20/2021 8:29 AM  Curtis Quinn  has presented today for surgery, with the diagnosis of biliary colie.  The various methods of treatment have been discussed with the patient and family. After consideration of risks, benefits and other options for treatment, the patient has consented to  Procedure(s): XI ROBOTIC P[place] (N/A) as a surgical intervention.  The patient's history has been reviewed, patient examined, no change in status, stable for surgery.  I have reviewed the patient's chart and labs.  Questions were answered to the patient's satisfaction.     [place]

## 2021-01-20 NOTE — Op Note (Signed)
Robotic assisted laparoscopic Cholecystectomy  Pre-operative Diagnosis: biliary colic  Post-operative Diagnosis: same  Procedure:  Robotic assisted laparoscopic Cholecystectomy  Surgeon: Caroleen Hamman, MD FACS  Anesthesia: Gen. with endotracheal tube  Findings: Chronic Cholecystitis   Estimated Blood Loss: 10cc       Specimens: Gallbladder           Complications: none   Procedure Details  The patient was seen again in the Holding Room. The benefits, complications, treatment options, and expected outcomes were discussed with the patient. The risks of bleeding, infection, recurrence of symptoms, failure to resolve symptoms, bile duct damage, bile duct leak, retained common bile duct stone, bowel injury, any of which could require further surgery and/or ERCP, stent, or papillotomy were reviewed with the patient. The likelihood of improving the patient's symptoms with return to their baseline status is good.  The patient and/or family concurred with the proposed plan, giving informed consent.  The patient was taken to Operating Room, identified  and the procedure verified as Laparoscopic Cholecystectomy.  A Time Out was held and the above information confirmed.  Prior to the induction of general anesthesia, antibiotic prophylaxis was administered. VTE prophylaxis was in place. General endotracheal anesthesia was then administered and tolerated well. After the induction, the abdomen was prepped with Chloraprep and draped in the sterile fashion. The patient was positioned in the supine position.  Cut down technique was used to enter the abdominal cavity and a Hasson trochar was placed after two vicryl stitches were anchored to the fascia. Pneumoperitoneum was then created with CO2 and tolerated well without any adverse changes in the patient's vital signs.  Three 8-mm ports were placed under direct vision. All skin incisions  were infiltrated with a local anesthetic agent before making the  incision and placing the trocars.   The patient was positioned  in reverse Trendelenburg, robot was brought to the surgical field and docked in the standard fashion.  We made sure all the instrumentation was kept indirect view at all times and that there were no collision between the arms. I scrubbed out and went to the console.  The gallbladder was identified, the fundus grasped and retracted cephalad. Adhesions were lysed bluntly. The infundibulum was grasped and retracted laterally, exposing the peritoneum overlying the triangle of Calot. This was then divided and exposed in a blunt fashion. An extended critical view of the cystic duct and cystic artery was obtained.  The cystic duct was clearly identified and bluntly dissected.   Artery and duct were double clipped and divided. Using ICG cholangiography we visualize the cystic duct and CBD, no evidence of bile injuries were observed. The gallbladder was taken from the gallbladder fossa in a retrograde fashion with the electrocautery.  Hemostasis was achieved with the electrocautery. nspection of the right upper quadrant was performed. No bleeding, bile duct injury or leak, or bowel injury was noted. Robotic instruments and robotic arms were undocked in the standard fashion.  I scrubbed back in.  The gallbladder was removed and placed in an Endocatch bag.   Pneumoperitoneum was released.  The periumbilical port site was closed with interrumpted 0 Vicryl sutures. 4-0 subcuticular Monocryl was used to close the skin. Dermabond was  applied.  The patient was then extubated and brought to the recovery room in stable condition. Sponge, lap, and needle counts were correct at closure and at the conclusion of the case.               Caroleen Hamman, MD,  FACS

## 2021-01-20 NOTE — Transfer of Care (Signed)
Immediate Anesthesia Transfer of Care Note  Patient: Curtis Quinn  Procedure(s) Performed: XI ROBOTIC ASSISTED LAPAROSCOPIC CHOLECYSTECTOMY  Patient Location: PACU  Anesthesia Type:General  Level of Consciousness: awake, drowsy and patient cooperative  Airway & Oxygen Therapy: Patient Spontanous Breathing and Patient connected to face mask oxygen  Post-op Assessment: Report given to RN and Post -op Vital signs reviewed and stable  Post vital signs: Reviewed and stable  Last Vitals:  Vitals Value Taken Time  BP 142/58 01/20/21 1009  Temp    Pulse 60 01/20/21 1011  Resp 14 01/20/21 1011  SpO2 100 % 01/20/21 1011  Vitals shown include unvalidated device data.  Last Pain:  Vitals:   01/20/21 0747  TempSrc: Oral  PainSc: 0-No pain         Complications: No notable events documented.

## 2021-01-20 NOTE — Anesthesia Procedure Notes (Signed)
Procedure Name: Intubation Date/Time: 01/20/2021 8:59 AM Performed by: Kelton Pillar, CRNA Pre-anesthesia Checklist: Patient identified, Emergency Drugs available, Suction available and Patient being monitored Patient Re-evaluated:Patient Re-evaluated prior to induction Oxygen Delivery Method: Circle system utilized Preoxygenation: Pre-oxygenation with 100% oxygen Induction Type: IV induction Ventilation: Mask ventilation without difficulty Laryngoscope Size: McGraph and 4 Grade View: Grade I Tube type: Oral Tube size: 7.0 mm Number of attempts: 1 Airway Equipment and Method: Stylet and Oral airway Placement Confirmation: ETT inserted through vocal cords under direct vision, positive ETCO2, breath sounds checked- equal and bilateral and CO2 detector Secured at: 21 cm Tube secured with: Tape Dental Injury: Teeth and Oropharynx as per pre-operative assessment

## 2021-01-20 NOTE — Anesthesia Postprocedure Evaluation (Signed)
Anesthesia Post Note  Patient: Curtis Quinn  Procedure(s) Performed: XI ROBOTIC ASSISTED LAPAROSCOPIC CHOLECYSTECTOMY  Patient location during evaluation: PACU Anesthesia Type: General Level of consciousness: awake and alert Pain management: pain level controlled Vital Signs Assessment: post-procedure vital signs reviewed and stable Respiratory status: spontaneous breathing and respiratory function stable Cardiovascular status: stable Anesthetic complications: no   No notable events documented.   Last Vitals:  Vitals:   01/20/21 1010 01/20/21 1015  BP: (!) 142/58 (!) 139/57  Pulse: 64 (!) 59  Resp: 16 15  Temp: 36.6 C   SpO2: 100% 100%    Last Pain:  Vitals:   01/20/21 1010  TempSrc:   PainSc: Asleep                 [name] K

## 2021-01-20 NOTE — Telephone Encounter (Signed)
LMOV to schedule  

## 2021-01-20 NOTE — Discharge Instructions (Addendum)

## 2021-01-20 NOTE — Telephone Encounter (Signed)
-----   Message from Minna Merritts, MD sent at 01/20/2021 12:14 PM EDT ----- Regarding: appt Seen in postoperative holding for atrial flutter following gallbladder surgery today June 23 Can we arrange follow-up in clinic with myself At his convenience 1 to 2 weeks potentially to discuss blood thinners Wife is aware, she may be a good contact to arrange appointment Thx TG

## 2021-01-21 ENCOUNTER — Encounter: Payer: Self-pay | Admitting: Urology

## 2021-01-21 LAB — SURGICAL PATHOLOGY

## 2021-01-23 ENCOUNTER — Other Ambulatory Visit: Payer: Self-pay | Admitting: Urology

## 2021-01-23 DIAGNOSIS — C61 Malignant neoplasm of prostate: Secondary | ICD-10-CM

## 2021-01-23 NOTE — Progress Notes (Signed)
Radiology order

## 2021-01-28 ENCOUNTER — Encounter: Payer: Self-pay | Admitting: *Deleted

## 2021-02-02 NOTE — Progress Notes (Signed)
Cardiology Office Note  Date:  02/04/2021   ID:  Curtis Quinn, DOB [birth year], MRN [id number]  PCP:  [name], MD   Chief Complaint  Patient presents with   [facility] follow up     Discuss abnormal findings on an EKG post op the CHOLECYSTECTOMY. Patient c/o shortness of breath and irregular heartbeats at times. Medications reviewed by the patient verbally.     HPI:  Curtis Quinn is a 80 year old gentleman with past medical history of Hypertension  hyperlipidemia History of chest pain 2015 work-up at that time with stress test Echo: Ejection fraction 50% inferior wall motion abnormality 2015 Previously followed at [facility] hx of prostate cancer  biliary colic at surgery, seen 01/20/2021 for the evaluation of postoperative atrial flutter Who presents for office evaluation for his atrial flutter noted postoperatively  EKG dated Dec 24 2020 noted to be normal sinus rhythm  Had surgery with Dr. [name], robotic assisted lap chole, uncomplicated On telemetry postoperatively noted to be in atrial flutter, rates in the 60 range, asymptomatic On further discussion he is Jehovah's Witness, there is concern of his prostate cancer status, ongoing testing pending Rate was well controlled he was asymptomatic and flutter and no new medications were started postoperatively  Today's visit feels well no complaints EKG showing normal sinus rhythm  Active at home, walks the dog He does appreciate rare palpitations, PVCs noted today, ectopy on exam  EKG personally reviewed by myself on todays visit Shows normal sinus rhythm rate 74 bpm PVCs  Review of prior records, recently moved from [place], We have requested and received records from [facility]  Carotid ultrasound January 2018 no hemodynamically significant stenosis  Prior stress test October 2015 for chest pain shortness of breath jaw pain Showed " increased chronotropic response and small area of ischemia" Appears  he was treated medically with beta-blockers nitrates statin  PMH:   has a past medical history of Arthritis, Cancer ([place]), Epilepsy ([place]), Heart murmur, History of kidney stones, Hypercholesteremia, Hypertension, Shingles, and Tremor.  PSH:    Past Surgical History:  Procedure Laterality Date   FRACTURE SURGERY     bil heels-plates and screws   HARDWARE REMOVAL Right    foot   HEEL SPUR SURGERY N/A    TONSILLECTOMY      Current Outpatient Medications  Medication Sig Dispense Refill   simvastatin (ZOCOR) 20 MG tablet Take 20 mg by mouth at bedtime.     HYDROcodone-acetaminophen (NORCO/VICODIN) 5-325 MG tablet Take 1-2 tablets by mouth every 4 (four) hours as needed for moderate pain. 20 tablet 0   No current facility-administered medications for this visit.     Allergies:   Penicillins   Social History:  The patient  reports that he has never smoked. He has never used smokeless tobacco. He reports current alcohol use. He reports that he does not use drugs.   Family History:   family history is not on file.    Review of Systems: Review of Systems  Constitutional: Negative.   HENT: Negative.    Respiratory: Negative.    Cardiovascular:  Positive for palpitations.  Gastrointestinal: Negative.   Musculoskeletal: Negative.   Neurological: Negative.   Psychiatric/Behavioral: Negative.    All other systems reviewed and are negative.   PHYSICAL EXAM: VS:  BP 140/72 (BP Location: Right Arm, Patient Position: Sitting, Cuff Size: Normal)   Pulse 74   Ht 6' (1.829 m)   Wt 176 lb (79.8 kg)   SpO2 98%  BMI 23.87 kg/m  , BMI Body mass index is 23.87 kg/m. GEN: Well nourished, well developed, in no acute distress HEENT: normal Neck: no JVD, carotid bruits, or masses Cardiac: RRR; no murmurs, rubs, or gallops,no edema  Respiratory:  clear to auscultation bilaterally, normal work of breathing GI: soft, nontender, nondistended, + BS MS: no deformity or atrophy Skin: warm and  dry, no rash Neuro:  Strength and sensation are intact Psych: euthymic mood, full affect   Recent Labs: 01/03/2021: ALT 12; BUN 24; Creatinine, Ser 0.95; Hemoglobin 11.6; Platelets 257; Potassium 3.8; Sodium 137    Lipid Panel No results found for: CHOL, HDL, LDLCALC, TRIG    Wt Readings from Last 3 Encounters:  02/04/21 176 lb (79.8 kg)  01/20/21 175 lb (79.4 kg)  01/14/21 175 lb (79.4 kg)      ASSESSMENT AND PLAN:  Problem List Items Addressed This Visit   None Visit Diagnoses     Typical atrial flutter ([place])    -  Primary   Relevant Orders   EKG 12-Lead   Patient is Jehovah's Witness       Relevant Orders   EKG 12-Lead      Atrial flutter Jehovah's Witness, does not want anticoagulation We will place a ZIO monitor given he has some irregular rhythm on his blood pressure cuff on measurements at home This may be secondary to PVCs though unable to exclude tachyarrhythmia such as atrial flutter Recommend metoprolol evening before and morning of any surgeries in effort to minimize arrhythmia   Palpitations/PVCs Very mild symptoms, monitor placed as above  Prostate cancer Has MRI a week from Friday   Total encounter time more than 35 minutes  Greater than 50% was spent in counseling and coordination of care with the patient     Signed, Esmond Plants, M.D., Ph.D. [place], [place]

## 2021-02-04 ENCOUNTER — Other Ambulatory Visit: Payer: Self-pay

## 2021-02-04 ENCOUNTER — Ambulatory Visit: Payer: Medicare HMO | Admitting: Cardiovascular Disease

## 2021-02-04 ENCOUNTER — Encounter: Payer: Self-pay | Admitting: Cardiovascular Disease

## 2021-02-04 VITALS — BP 140/72 | HR 74 | Ht 72.0 in | Wt 176.0 lb

## 2021-02-04 DIAGNOSIS — IMO0001 Reserved for inherently not codable concepts without codable children: Secondary | ICD-10-CM

## 2021-02-04 DIAGNOSIS — Z789 Other specified health status: Secondary | ICD-10-CM | POA: Diagnosis not present

## 2021-02-04 DIAGNOSIS — I483 Typical atrial flutter: Secondary | ICD-10-CM

## 2021-02-04 MED ORDER — METOPROLOL TARTRATE 25 MG PO TABS
25.0000 mg | ORAL_TABLET | Freq: Every day | ORAL | 0 refills | Status: DC | PRN
Start: 2021-02-04 — End: 2021-03-30

## 2021-02-04 NOTE — Patient Instructions (Addendum)
Medication Instructions:  Please take metoprolol tartrate 25 mg night before and morning of any surgery  If you need a refill on your cardiac medications before your next appointment, please call your pharmacy.   Lab work: No new labs needed  Testing/Procedures: Zio (heart monitor) wear for 14 days Monitor to be mailed to you to place on Apply after your MRI   Follow-Up: At [facility], you and your health needs are our priority.  As part of our continuing mission to provide you with exceptional heart care, we have created designated Provider Care Teams.  These Care Teams include your primary Cardiologist (physician) and Advanced Practice Providers (APPs -  Physician Assistants and Nurse Practitioners) who all work together to provide you with the care you need, when you need it.  You will need a follow up appointment in 12 months  Providers on your designated Care Team:   Murray Hodgkins, NP Christell Faith, PA-C Marrianne Mood, PA-C Cadence Vandervoort, [place]   COVID-19 Vaccine Information can be found at: [email or web address].uk For questions related to vaccine distribution or appointments, please email [email or web address] or call [phone].   Heart monitor (Zio patch) for 2 weeks (14 days)   Your physician has recommended that you wear a Zio monitor. This monitor is a medical device that records the heart's electrical activity. Doctors most often use these monitors to diagnose arrhythmias. Arrhythmias are problems with the speed or rhythm of the heartbeat. The monitor is a small device applied to your chest. You can wear one while you do your normal daily activities. While wearing this monitor if you have any symptoms to push the button and record what you felt. Once you have worn this monitor for the period of time provider prescribed (Usually 14 days), you will return the monitor device in the postage paid box. Once it is  returned they will download the data collected and provide [place] with a report which the provider will then review and we will call you with those results. Important tips:  Avoid showering during the first 24 hours of wearing the monitor. Avoid excessive sweating to help maximize wear time. Do not submerge the device, no hot tubs, and no swimming pools. Keep any lotions or oils away from the patch. After 24 hours you may shower with the patch on. Take brief showers with your back facing the shower head.  Do not remove patch once it has been placed because that will interrupt data and decrease adhesive wear time. Push the button when you have any symptoms and write down what you were feeling. Once you have completed wearing your monitor, remove and place into box which has postage paid and place in your outgoing mailbox.  If for some reason you have misplaced your box then call our office and we can provide another box and/or mail it off for you.

## 2021-02-09 ENCOUNTER — Encounter: Payer: Self-pay | Admitting: Surgery

## 2021-02-09 ENCOUNTER — Other Ambulatory Visit: Payer: Self-pay

## 2021-02-09 ENCOUNTER — Ambulatory Visit (INDEPENDENT_AMBULATORY_CARE_PROVIDER_SITE_OTHER): Payer: Medicare HMO | Admitting: Surgery

## 2021-02-09 VITALS — BP 145/75 | HR 70 | Temp 98.4°F | Resp 14 | Ht 72.0 in | Wt 177.0 lb

## 2021-02-09 DIAGNOSIS — Z09 Encounter for follow-up examination after completed treatment for conditions other than malignant neoplasm: Secondary | ICD-10-CM

## 2021-02-09 NOTE — Patient Instructions (Addendum)
GENERAL POST-OPERATIVE PATIENT INSTRUCTIONS   WOUND CARE INSTRUCTIONS:  Keep a dry clean dressing on the wound if there is drainage. The initial bandage may be removed after 24 hours.  Once the wound has quit draining you may leave it open to air.  If clothing rubs against the wound or causes irritation and the wound is not draining you may cover it with a dry dressing during the daytime.  Try to keep the wound dry and avoid ointments on the wound unless directed to do so.  If the wound becomes bright red and painful or starts to drain infected material that is not clear, please contact your physician immediately.  If the wound is mildly pink and has a thick firm ridge underneath it, this is normal, and is referred to as a healing ridge.  This will resolve over the next 4-6 weeks.  BATHING: You may shower if you have been informed of this by your surgeon. However, Please do not submerge in a tub, hot tub, or pool until incisions are completely sealed or have been told by your surgeon that you may do so.  DIET:  You may eat any foods that you can tolerate.  It is a good idea to eat a high fiber diet and take in plenty of fluids to prevent constipation.  If you do become constipated you may want to take a mild laxative or take ducolax tablets on a daily basis until your bowel habits are regular.  Constipation can be very uncomfortable, along with straining, after recent surgery.  ACTIVITY:  You are encouraged to cough and deep breath or use your incentive spirometer if you were given one, every 15-30 minutes when awake.  This will help prevent respiratory complications and low grade fevers post-operatively if you had a general anesthetic.  You may want to hug a pillow when coughing and sneezing to add additional support to the surgical area, if you had abdominal or chest surgery, which will decrease pain during these times.  You are encouraged to walk and engage in light activity for the next two weeks.  You  should not lift more than 15 pounds, until 02/17/2021 as it could put you at increased risk for complications.  Twenty pounds is roughly equivalent to a plastic bag of groceries. At that time- Listen to your body when lifting, if you have pain when lifting, stop and then try again in a few days. Soreness after doing exercises or activities of daily living is normal as you get back in to your normal routine.  MEDICATIONS:  Try to take narcotic medications and anti-inflammatory medications, such as tylenol, ibuprofen, naprosyn, etc., with food.  This will minimize stomach upset from the medication.  Should you develop nausea and vomiting from the pain medication, or develop a rash, please discontinue the medication and contact your physician.  You should not drive, make important decisions, or operate machinery when taking narcotic pain medication.  SUNBLOCK Use sun block to incision area over the next year if this area will be exposed to sun. This helps decrease scarring and will allow you avoid a permanent darkened area over your incision.     If you have any concerns or questions, please feel free to call our office.   Gallbladder Eating Plan  If you have a gallbladder condition, you may have trouble digesting fats. Eating a low-fat diet can help reduce your symptoms, and may be helpful before and after having surgery to remove your gallbladder (cholecystectomy).  Your health care provider may recommend that you work with a diet and nutrition specialist (dietitian) to help you reduce the amount of fat in your diet. What are tips for following this plan? General guidelines Limit your fat intake to less than 30% of your total daily calories. If you eat around 1,800 calories each day, this is less than 60 grams (g) of fat per day. Fat is an important part of a healthy diet. Eating a low-fat diet can make it hard to maintain a healthy body weight. Ask your dietitian how much fat, calories, and other  nutrients you need each day. Eat small, frequent meals throughout the day instead of three large meals. Drink at least 8-10 cups of fluid a day. Drink enough fluid to keep your urine clear or pale yellow. Limit alcohol intake to no more than 1 drink a day for nonpregnant women and 2 drinks a day for men. One drink equals 12 oz of beer, 5 oz of wine, or 1 oz of hard liquor. Reading food labels  Check Nutrition Facts on food labels for the amount of fat per serving. Choose foods with less than 3 grams of fat per serving.  Shopping Choose nonfat and low-fat healthy foods. Look for the words "nonfat," "low fat," or "fat free." Avoid buying processed or prepackaged foods. Cooking Cook using low-fat methods, such as baking, broiling, grilling, or boiling. Cook with small amounts of healthy fats, such as olive oil, grapeseed oil, canola oil, or sunflower oil. What foods are recommended? All fresh, frozen, or canned fruits and vegetables. Whole grains. Low-fat or non-fat (skim) milk and yogurt. Lean meat, skinless poultry, fish, eggs, and beans. Low-fat protein supplement powders or drinks. Spices and herbs. What foods are not recommended? High-fat foods. These include baked goods, fast food, fatty cuts of meat, ice cream, french toast, sweet rolls, pizza, cheese bread, foods covered with butter, creamy sauces, or cheese. Fried foods. These include french fries, tempura, battered fish, breaded chicken, fried breads, and sweets. Foods with strong odors. Foods that cause bloating and gas. Summary A low-fat diet can be helpful if you have a gallbladder condition, or before and after gallbladder surgery. Limit your fat intake to less than 30% of your total daily calories. This is about 60 g of fat if you eat 1,800 calories each day. Eat small, frequent meals throughout the day instead of three large meals. This information is not intended to replace advice given to you by your health care  provider. Make sure you discuss any questions you have with your healthcare provider. Document Revised: 03/04/2020 Document Reviewed: 03/04/2020 Elsevier Patient Education  2022 [employer].

## 2021-02-11 ENCOUNTER — Ambulatory Visit (INDEPENDENT_AMBULATORY_CARE_PROVIDER_SITE_OTHER): Payer: Medicare HMO

## 2021-02-11 ENCOUNTER — Ambulatory Visit
Admission: RE | Admit: 2021-02-11 | Discharge: 2021-02-11 | Disposition: A | Discharge status: Home / Self Care | Payer: Medicare HMO | Source: Ambulatory Visit | Attending: Urology | Admitting: Urology

## 2021-02-11 ENCOUNTER — Other Ambulatory Visit: Payer: Self-pay

## 2021-02-11 DIAGNOSIS — C61 Malignant neoplasm of prostate: Secondary | ICD-10-CM | POA: Diagnosis not present

## 2021-02-11 DIAGNOSIS — I483 Typical atrial flutter: Secondary | ICD-10-CM

## 2021-02-11 MED ORDER — GADOBUTROL 1 MMOL/ML IV SOLN
7.5000 mL | Freq: Once | INTRAVENOUS | Status: AC | PRN
  Administered 2021-02-11: 7.5 mL via INTRAVENOUS

## 2021-02-11 NOTE — Progress Notes (Signed)
Mr. Curtis Quinn is status post robotic cholecystectomy that was uneventful.  He is doing very well.  Tolerating diet currently no pain.  Pathology discussed with patient in detail.  PE NAD Abd: Soft incision healing well without evidence of infection no peritonitis  A/P Doing very well RTC prn

## 2021-02-18 ENCOUNTER — Telehealth: Payer: Self-pay | Admitting: Urology

## 2021-02-18 NOTE — Telephone Encounter (Signed)
Contacted patient regarding negative prostate/pelvic MRI.  PSMA PET/CT was denied.  Left VM to call back to discuss management options.

## 2021-03-04 ENCOUNTER — Ambulatory Visit: Payer: Medicare HMO | Admitting: Cardiovascular Disease

## 2021-03-10 ENCOUNTER — Telehealth: Payer: Self-pay

## 2021-03-10 MED ORDER — APIXABAN 5 MG PO TABS: 5.0000 mg | ORAL_TABLET | Freq: Two times a day (BID) | ORAL | 3 refills | Status: DC

## 2021-03-10 MED ORDER — METOPROLOL SUCCINATE ER 25 MG PO TB24: 25.0000 mg | ORAL_TABLET | Freq: Every day | ORAL | 3 refills | Status: DC

## 2021-03-10 NOTE — Telephone Encounter (Signed)
Was able to reach out to pt via phone and make contact to review their recent ZIO monitor results. Dr. Rockey Situ advised based on the current results   Event monitor  Noted to have more arrhythmia, atrial fib/flutter as noted previously, sometimes with long runs  Would suggest anticoagulation with eliquis 5 BID for stroke prevention  Also metoprolol succinate 25 daily in effort to limit number of episodes   Reviewed and educated Curtis Quinn on the difference of his metoprolol tartrate he currently takes PRN and the metoprolol succinate Dr. Rockey Situ wants to start to help control rate. Also, educated pt on Eliquis risk and benefits. Pt verbalized understanding, is agreeable to start both medications, will send in to [facility].  Curtis Quinn is thankful for the results call, all questions and concerns were address. Will call back for anything further, f/u as schedule.

## 2021-03-22 ENCOUNTER — Telehealth: Payer: Self-pay | Admitting: Urology

## 2021-03-22 ENCOUNTER — Encounter: Payer: Self-pay | Admitting: Urology

## 2021-03-22 NOTE — Telephone Encounter (Signed)
Curtis Quinn has gotten his insurance to approve the PET scan.  Can we go ahead and get this done?

## 2021-03-25 ENCOUNTER — Other Ambulatory Visit: Payer: Self-pay

## 2021-03-25 MED ORDER — RIVAROXABAN 20 MG PO TABS: 20.0000 mg | ORAL_TABLET | Freq: Every day | ORAL | 11 refills | Status: DC

## 2021-03-25 NOTE — Progress Notes (Signed)
Eliquis was switch to Xarelto d/t cost.   PA application started for Xarelto 20 mg daily Dr. Rockey Situ has signed PA application. Waiting on pt's portion (page 2).

## 2021-03-30 ENCOUNTER — Other Ambulatory Visit: Payer: Self-pay

## 2021-03-30 ENCOUNTER — Telehealth: Payer: Self-pay

## 2021-03-30 MED ORDER — METOPROLOL SUCCINATE ER 25 MG PO TB24: 25.0000 mg | ORAL_TABLET | Freq: Every day | ORAL | 3 refills | Status: DC

## 2021-03-30 MED ORDER — SIMVASTATIN 20 MG PO TABS: 20.0000 mg | ORAL_TABLET | Freq: Every day | ORAL | 3 refills | Status: DC

## 2021-03-30 MED ORDER — METOPROLOL TARTRATE 25 MG PO TABS: 25.0000 mg | ORAL_TABLET | Freq: Every day | ORAL | 4 refills | Status: DC | PRN

## 2021-03-30 NOTE — Telephone Encounter (Addendum)
Pt was given Eliquis samples in clinic today during his office visit with his wife Curtis Quinn who seen Dr. Rockey Situ. Dr. Rockey Situ advised nurse to give pt Eliquis samples as he has his PA application. Pt was given Eliquis 4 boxes of samples, pt did not correct nurse he was on Xarelto according to his EMR medication list. PA application he brought in was for Xarelto.  Eliquis 4 boxes of free samples Lot: [id number] (3 boxes) Lot: [id number] (1 box ) EXP 5/24 (all boxes)  Attempted to call pt's cell and wife's cell multiple times to make contact of the mistake, LMTCB.  Curtis Quinn, supervisir made aware of circumstance, advised to keep calling the pt to ask for Curtis Quinn to bring back the samples and Xarelto may be given.  Per notes from 03/25/2021  Eliquis was switch to Xarelto d/t cost.    PA application started for Xarelto 20 mg daily Waiting on Dr. Rockey Situ to sign PA application.

## 2021-04-01 ENCOUNTER — Telehealth: Payer: Self-pay

## 2021-04-01 NOTE — Telephone Encounter (Signed)
Was able to reach out to pt's wife Malachy Mood to explain the mix-up on pt's PA application for Xarelto. Advised I have the complete application in my folder, waiting on Dr. Rockey Situ to sign. Advised will have Dr. Rockey Situ to sign this afternoon and will have his completed application fax this afternoon as well.  Malachy Mood is very thankful, verbalized understanding, and apologies on her husband's behalf towards the clinical staff when he came to return Eliquis samples. No worries explained to [place].   Samples 4 boxes return  Eliquis 4 boxes of free samples Lot: [id number] (3 boxes) Lot: [id number] (1 box ) EXP 5/24 (all boxes)  Izora Gala supervisor aware of return, placed on her desk.

## 2021-04-01 NOTE — Telephone Encounter (Signed)
Completed PA application for Xarelto faxed Dr. Rockey Situ signed application  Waiting on PA approval or denial at this time

## 2021-04-01 NOTE — Telephone Encounter (Signed)
Patient came by office to return/pick up samples Asked patient to fill out page 2 of assistance paperwork but patient refused Claims he already filled this out and dropped off in an envelope  Heather checked box but was unable to locate  Patient would like to clarify where this paperwork is since it has sensitive information Patient will be at hospital Tuesday and aware that [facility] will be back in office so he will stop by to discuss

## 2021-04-05 ENCOUNTER — Other Ambulatory Visit: Payer: Self-pay

## 2021-04-05 ENCOUNTER — Ambulatory Visit
Admission: RE | Admit: 2021-04-05 | Discharge: 2021-04-05 | Disposition: A | Discharge status: Home / Self Care | Payer: Medicare HMO | Source: Ambulatory Visit | Attending: Urology | Admitting: Urology

## 2021-04-05 DIAGNOSIS — C61 Malignant neoplasm of prostate: Secondary | ICD-10-CM | POA: Diagnosis not present

## 2021-04-05 DIAGNOSIS — N62 Hypertrophy of breast: Secondary | ICD-10-CM | POA: Insufficient documentation

## 2021-04-05 DIAGNOSIS — N200 Calculus of kidney: Secondary | ICD-10-CM | POA: Insufficient documentation

## 2021-04-05 MED ORDER — PIFLIFOLASTAT F 18 (PYLARIFY) INJECTION
9.0000 | Freq: Once | INTRAVENOUS | Status: AC
  Administered 2021-04-05: 9.2 via INTRAVENOUS

## 2021-04-05 NOTE — Telephone Encounter (Signed)
Incoming fax from [place] [facility] [place].  Patient was denied  due to not meeting the requirements for the program.

## 2021-04-05 NOTE — Telephone Encounter (Signed)
Faxed PA application and denial letter to PharmD for review  Pt denied Xarelto  PT ID [id number]

## 2021-04-06 NOTE — Telephone Encounter (Signed)
Was able to reach out to pt's wife to advised on Curtis Quinn denied coverage for Xarelto, advised that our pharmacy team took a look at the application and stated  "It looks like patient marked that he has medicare advantage plan, however he does not have a medicare advantage plan, he only has a part B plan through [place]. So essentially he told them that he has prescription coverage when he does not"  Pharmacy suggested "have the patient call and explain that he does not have any Rx coverage. He does not have an advantage plan, only part B" hopefully providing this information will update his application and give him coverage.   Wife verbalized understanding, wants info sent through [place] so Curtis Quinn will be able to see and provide number to call so this can be corrected.

## 2021-04-08 ENCOUNTER — Encounter: Payer: Self-pay | Admitting: Urology

## 2021-04-08 ENCOUNTER — Other Ambulatory Visit: Payer: Self-pay | Admitting: Urology

## 2021-04-08 DIAGNOSIS — C61 Malignant neoplasm of prostate: Secondary | ICD-10-CM

## 2021-04-12 ENCOUNTER — Inpatient Hospital Stay: Payer: Medicare HMO | Attending: Internal Medicine | Admitting: Internal Medicine

## 2021-04-12 ENCOUNTER — Encounter: Payer: Self-pay | Admitting: Internal Medicine

## 2021-04-12 ENCOUNTER — Inpatient Hospital Stay: Payer: Medicare HMO

## 2021-04-12 ENCOUNTER — Other Ambulatory Visit: Payer: Self-pay

## 2021-04-12 VITALS — BP 131/70 | HR 65 | Temp 97.7°F | Resp 18 | Wt 177.2 lb

## 2021-04-12 DIAGNOSIS — R2241 Localized swelling, mass and lump, right lower limb: Secondary | ICD-10-CM

## 2021-04-12 DIAGNOSIS — I10 Essential (primary) hypertension: Secondary | ICD-10-CM | POA: Insufficient documentation

## 2021-04-12 DIAGNOSIS — I4891 Unspecified atrial fibrillation: Secondary | ICD-10-CM | POA: Diagnosis not present

## 2021-04-12 DIAGNOSIS — M7989 Other specified soft tissue disorders: Secondary | ICD-10-CM | POA: Diagnosis not present

## 2021-04-12 DIAGNOSIS — C61 Malignant neoplasm of prostate: Secondary | ICD-10-CM | POA: Insufficient documentation

## 2021-04-12 LAB — COMPREHENSIVE METABOLIC PANEL
ALT: 11 U/L (ref 0–44)
AST: 19 U/L (ref 15–41)
Albumin: 4.1 g/dL (ref 3.5–5.0)
Alkaline Phosphatase: 73 U/L (ref 38–126)
Anion gap: 6 (ref 5–15)
BUN: 26 mg/dL — ABNORMAL HIGH (ref 8–23)
CO2: 28 mmol/L (ref 22–32)
Calcium: 9.2 mg/dL (ref 8.9–10.3)
Chloride: 104 mmol/L (ref 98–111)
Creatinine, Ser: 1.01 mg/dL (ref 0.61–1.24)
GFR, Estimated: >60 mL/min (ref >60)
Glucose, Bld: 89 mg/dL (ref 70–99)
Potassium: 4 mmol/L (ref 3.5–5.1)
Sodium: 138 mmol/L (ref 135–145)
Total Bilirubin: 1.1 mg/dL (ref 0.3–1.2)
Total Protein: 8 g/dL (ref 6.5–8.1)

## 2021-04-12 LAB — CBC WITH DIFFERENTIAL/PLATELET
Abs Immature Granulocytes: 0.01 10*3/uL (ref 0.00–0.07)
Basophils Absolute: 0 10*3/uL (ref 0.0–0.1)
Basophils Relative: 0 %
Eosinophils Absolute: 0 10*3/uL (ref 0.0–0.5)
Eosinophils Relative: 0 %
HCT: 36.9 % — ABNORMAL LOW (ref 39.0–52.0)
Hemoglobin: 12.7 g/dL — ABNORMAL LOW (ref 13.0–17.0)
Immature Granulocytes: 0 %
Lymphocytes Relative: 19 %
Lymphs Abs: 1 10*3/uL (ref 0.7–4.0)
MCH: 30.8 pg (ref 26.0–34.0)
MCHC: 34.4 g/dL (ref 30.0–36.0)
MCV: 89.3 fL (ref 80.0–100.0)
Monocytes Absolute: 0.4 10*3/uL (ref 0.1–1.0)
Monocytes Relative: 7 %
Neutro Abs: 3.9 10*3/uL (ref 1.7–7.7)
Neutrophils Relative %: 74 %
Platelets: 195 10*3/uL (ref 150–400)
RBC: 4.13 MIL/uL — ABNORMAL LOW (ref 4.22–5.81)
RDW: 13.1 % (ref 11.5–15.5)
WBC: 5.4 10*3/uL (ref 4.0–10.5)
nRBC: 0 % (ref 0.0–0.2)

## 2021-04-12 LAB — LACTATE DEHYDROGENASE: LDH: 124 U/L (ref 98–192)

## 2021-04-12 LAB — PSA: Prostatic Specific Antigen: 1.15 ng/mL (ref 0.00–4.00)

## 2021-04-12 NOTE — Assessment & Plan Note (Signed)
#  Prostate cancer-status post cryoablation; PSA 1.67 [June 2022]; August 2022 PSMA PET scan-negative for any bone lesions/prostatic lesion; incidental 4 x 1.7 cm soft tissue patient noted inferior/posterior to right kidney/psoas-question etiology.  We will review at the tumor conference; discussed the possible need for biopsy.  Repeat PSA  # Right thigh swelling: Since DEC 2021- 5 x7 cm- swelling/discomfort-check soft tissue ultrasound; also reviewed PET scan at tumor conference.  Consider MRI.   # A.fib- never started on xarelto [facility] witness;Dr.Gollan]; awaiting blood counts from surgery to improved prior to starting xarelto.  Defer to cardiology.  Understands the risk of stroke without anticoagulation.  #Right lower lobe anterior-infiltrate incidental PET scan- ? Question pneumonia patient not clinically symptomatic.  Follow-up antibiotics.  Thank you Dr.Stoioff for allowing me to participate in the care of your pleasant patient. Please do not hesitate to contact me with questions or concerns in the interim.  # DISPOSITION: # labs today-  # follow TBD- Dr.B  # I reviewed the blood work- with the patient in detail; also reviewed the imaging independently [as summarized above]; and with the patient in detail.  '

## 2021-04-12 NOTE — Progress Notes (Signed)
[place] OFFICE PROGRESS NOTE  Patient Care Team: [name], MD as PCP - General (Family Medicine)  Cancer Staging No matching staging information was found for the patient.   Oncology History Overview Note  Recently moved to the area from [place] He brought records from his prior urologist which were copied, scanned into the chart and reviewed Diagnosed with prostate cancer June 2018 with a PSA of 73.  11/12 cores positive prostate biopsy with Gleason 3+3/3+4  MRI showed a 4.5 cm PI-RADS 5 lesion right prostate with extracapsular extension and involvement of the right seminal vesicle Declined radiation therapy Treated with targeted cryoablation with high-dose bicalutamide and [place] x2 years [SEP 2021] He last saw his urologist in [place] November 2021 and PSA was undetectable at <0.04.  Had been off ADT for 6 months at the time of this visit At recent PCP visit his PSA was detectable at 1.67 on 01/03/2021 IMPRESSION: 1. Abnormal tracer avid soft tissue density along the inferior aspect of the right posterior renal fascia, indeterminate. Suspicious for an atypical location of prostate cancer metastasis. Differential considerations include primary soft tissue neoplasm. Consider tissue sampling. 2. Otherwise, no findings of tracer avid primary or metastatic disease. 3. Inferior right upper lobe tracer avid reticulonodular opacity and consolidation, favoring pneumonia. ---------------------------------   S/p Falloff roof  [2007-crushed feet/quivering voice- prior used to talk in church ]; [facility] Witness [reluctant to take]    Prostate cancer ([place])  04/25/2013 Initial Diagnosis   Prostate cancer ([place])      HISTORY OF PRESENT ILLNESS: Alone.  Ambulating independently.  Aundria Mems 80 y.o.  male pleasant patient above history of prostate cancer has been referred to [place] by urology to discuss further evaluation/treatment.  Patient's prostate cancer  history reviewed at length/summarized as above.  In summary-PSA in June 2022 was slightly elevated at 1.67 [Dr.[name]; gallbladder surgery]-we will get a further evaluation with Dr. Bernardo Heater.  MRI prostate negative for any reoccurrence.  PSMA PET scan-incidental-inferior right renal fascia uptake 4 x 1.7 centimeters.  Also notes to have a thigh mass on the right side, noted approximately December 2021.  No increasing size.  Occasional discomfort.   Review of Systems  Constitutional:  Negative for chills, diaphoresis, fever, malaise/fatigue and weight loss.  HENT:  Negative for nosebleeds and sore throat.   Eyes:  Negative for double vision.  Respiratory:  Negative for cough, hemoptysis, sputum production, shortness of breath and wheezing.   Cardiovascular:  Negative for chest pain, palpitations, orthopnea and leg swelling.  Gastrointestinal:  Negative for abdominal pain, blood in stool, constipation, diarrhea, heartburn, melena, nausea and vomiting.  Genitourinary:  Negative for dysuria, frequency and urgency.  Musculoskeletal:  Positive for back pain and joint pain.  Skin: Negative.  Negative for itching and rash.  Neurological:  Negative for dizziness, tingling, focal weakness, weakness and headaches.  Endo/Heme/Allergies:  Does not bruise/bleed easily.  Psychiatric/Behavioral:  Negative for depression. The patient is not nervous/anxious and does not have insomnia.      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   Heart murmur    History of kidney stones    Hypercholesteremia    Hypertension    Shingles    Tremor     PAST SURGICAL HISTORY :   Past Surgical History:  Procedure Laterality Date   FRACTURE SURGERY  bil heels-plates and screws   HARDWARE REMOVAL Right    foot   HEEL SPUR SURGERY N/A    TONSILLECTOMY      FAMILY HISTORY :   Family History  Problem Relation  Age of Onset   Heart attack Mother    Stroke Father     SOCIAL HISTORY:   Social History   Tobacco Use   Smoking status: Never   Smokeless tobacco: Never  Vaping Use   Vaping Use: Never used  Substance Use Topics   Alcohol use: Yes    Comment: occ wine 2-3 times monthly   Drug use: Never    ALLERGIES:  is allergic to penicillins.  MEDICATIONS:  Current Outpatient Medications  Medication Sig Dispense Refill   simvastatin (ZOCOR) 20 MG tablet Take 1 tablet (20 mg total) by mouth at bedtime. 90 tablet 3   metoprolol tartrate (LOPRESSOR) 25 MG tablet Take 1 tablet (25 mg total) by mouth daily as needed (before surgery). (Patient not taking: Reported on 04/12/2021) 30 tablet 4   No current facility-administered medications for this visit.    PHYSICAL EXAMINATION: ECOG PERFORMANCE STATUS: 0 - Asymptomatic  BP 131/70   Pulse 65   Temp 97.7 F (36.5 C)   Resp 18   Wt 177 lb 4 oz (80.4 kg)   SpO2 100%   BMI 24.04 kg/m   Filed Weights   04/12/21 0829  Weight: 177 lb 4 oz (80.4 kg)    Physical Exam Vitals and nursing note reviewed.  HENT:     Head: Normocephalic and atraumatic.     Mouth/Throat:     Pharynx: Oropharynx is clear.  Eyes:     Extraocular Movements: Extraocular movements intact.     Pupils: Pupils are equal, round, and reactive to light.  Cardiovascular:     Rate and Rhythm: Normal rate and regular rhythm.  Pulmonary:     Comments: Decreased breath sounds bilaterally.  Abdominal:     Palpations: Abdomen is soft.  Musculoskeletal:        General: Normal range of motion.     Cervical back: Normal range of motion.  Skin:    General: Skin is warm.  Neurological:     General: No focal deficit present.     Mental Status: He is alert and oriented to person, place, and time.  Psychiatric:        Behavior: Behavior normal.        Judgment: Judgment normal.     LABORATORY DATA:  I have reviewed the data as listed    Component Value Date/Time    NA 138 04/12/2021 0937   K 4.0 04/12/2021 0937   CL 104 04/12/2021 0937   CO2 28 04/12/2021 0937   GLUCOSE 89 04/12/2021 0937   BUN 26 (H) 04/12/2021 0937   CREATININE 1.01 04/12/2021 0937   CALCIUM 9.2 04/12/2021 0937   PROT 8.0 04/12/2021 0937   ALBUMIN 4.1 04/12/2021 0937   AST 19 04/12/2021 0937   ALT 11 04/12/2021 0937   ALKPHOS 73 04/12/2021 0937   BILITOT 1.1 04/12/2021 0937   GFRNONAA >60 04/12/2021 0937    No results found for: SPEP, UPEP  Lab Results  Component Value Date   WBC 5.4 04/12/2021   NEUTROABS 3.9 04/12/2021   HGB 12.7 (L) 04/12/2021   HCT 36.9 (L) 04/12/2021   MCV 89.3 04/12/2021   PLT 195 04/12/2021      Chemistry      Component Value Date/Time  NA 138 04/12/2021 0937   K 4.0 04/12/2021 0937   CL 104 04/12/2021 0937   CO2 28 04/12/2021 0937   BUN 26 (H) 04/12/2021 0937   CREATININE 1.01 04/12/2021 0937      Component Value Date/Time   CALCIUM 9.2 04/12/2021 0937   ALKPHOS 73 04/12/2021 0937   AST 19 04/12/2021 0937   ALT 11 04/12/2021 0937   BILITOT 1.1 04/12/2021 0937       RADIOGRAPHIC STUDIES: I have personally reviewed the radiological images as listed and agreed with the findings in the report. No results found.   ASSESSMENT & PLAN:  Prostate cancer ([place]) #Prostate cancer-status post cryoablation; PSA 1.67 [June 2022]; August 2022 PSMA PET scan-negative for any bone lesions/prostatic lesion; incidental 4 x 1.7 cm soft tissue patient noted inferior/posterior to right kidney/psoas-question etiology.  We will review at the tumor conference; discussed the possible need for biopsy.  Repeat PSA  # Right thigh swelling: Since DEC 2021- 5 x7 cm- swelling/discomfort-check soft tissue ultrasound; also reviewed PET scan at tumor conference.  Consider MRI.   # A.fib- never started on xarelto [facility] witness;Dr.Gollan]; awaiting blood counts from surgery to improved prior to starting xarelto.  Defer to cardiology.  Understands the risk of  stroke without anticoagulation.  #Right lower lobe anterior-infiltrate incidental PET scan- ? Question pneumonia patient not clinically symptomatic.  Follow-up antibiotics.  Thank you Dr.Stoioff for allowing me to participate in the care of your pleasant patient. Please do not hesitate to contact me with questions or concerns in the interim.  # DISPOSITION: # labs today-  # follow TBD- Dr.B  # I reviewed the blood work- with the patient in detail; also reviewed the imaging independently [as summarized above]; and with the patient in detail.  '   Orders Placed This Encounter  Procedures   US SOFT TISSUE LOWER EXTREMITY LIMITED RIGHT (NON-VASCULAR)    Standing Status:   Future    Standing Expiration Date:   04/12/2022    Order Specific Question:   Reason for Exam (SYMPTOM  OR DIAGNOSIS REQUIRED)    Answer:   right thigh mass    Order Specific Question:   Preferred imaging location?    Answer:   [facility]   CBC with Differential/Platelet    Standing Status:   Future    Number of Occurrences:   1    Standing Expiration Date:   04/12/2022   Comprehensive metabolic panel    Standing Status:   Future    Number of Occurrences:   1    Standing Expiration Date:   04/12/2022   PSA    Standing Status:   Future    Number of Occurrences:   1    Standing Expiration Date:   04/12/2022   Lactate dehydrogenase    Standing Status:   Future    Number of Occurrences:   1    Standing Expiration Date:   04/12/2022   All questions were answered. The patient knows to call the clinic with any problems, questions or concerns.      Cammie Sickle, MD 04/12/2021 12:15 PM

## 2021-04-13 ENCOUNTER — Ambulatory Visit
Admission: RE | Admit: 2021-04-13 | Discharge: 2021-04-13 | Disposition: A | Discharge status: Home / Self Care | Payer: Medicare HMO | Source: Ambulatory Visit | Attending: Internal Medicine | Admitting: Internal Medicine

## 2021-04-13 DIAGNOSIS — R2241 Localized swelling, mass and lump, right lower limb: Secondary | ICD-10-CM | POA: Diagnosis not present

## 2021-04-14 ENCOUNTER — Other Ambulatory Visit: Payer: Medicare HMO

## 2021-04-14 ENCOUNTER — Encounter: Payer: Self-pay | Admitting: Internal Medicine

## 2021-04-14 NOTE — Progress Notes (Signed)
On 9-15- spoke to patient regarding the discussion of the tumor conference with the plan for biopsy of the soft tissue lesion in the right pelvis the context of slightly elevated PSA at 1.23.   Patient will need follow up with MD one week post biopsy/ [place].

## 2021-04-14 NOTE — Progress Notes (Signed)
Tumor Board Documentation  Curtis Quinn was presented by Dr Rogue Bussing at our Tumor Board on 04/14/2021, which included representatives from medical oncology, surgical, radiology, pathology, radiation oncology, navigation, internal medicine, palliative care, research, nutrition, genetics, pharmacy, pulmonology.  Curtis Quinn currently presents as a new patient, for discussion with history of the following treatments: active survellience (ADT, Cryoablation therapy done in [place]).  Additionally, we reviewed previous medical and familial history, history of present illness, and recent lab results along with all available histopathologic and imaging studies. The tumor board considered available treatment options and made the following recommendations: Biopsy (By Interventional Radiology)    The following procedures/referrals were also placed: No orders of the defined types were placed in this encounter.   Clinical Trial Status: not discussed   Staging used: To be determined AJCC Staging:       Group: History of Prostate Cancer   National site-specific guidelines   were discussed with respect to the case.  Tumor board is a meeting of clinicians from various specialty areas who evaluate and discuss patients for whom a multidisciplinary approach is being considered. Final determinations in the plan of care are those of the provider(s). The responsibility for follow up of recommendations given during tumor board is that of the provider.   Today's extended care, comprehensive team conference, Curtis Quinn was not present for the discussion and was not examined.   Multidisciplinary Tumor Board is a multidisciplinary case peer review process.  Decisions discussed in the Multidisciplinary Tumor Board reflect the opinions of the specialists present at the conference without having examined the patient.  Ultimately, treatment and diagnostic decisions rest with the primary provider(s) and the patient.

## 2021-04-18 ENCOUNTER — Other Ambulatory Visit: Payer: Self-pay | Admitting: Internal Medicine

## 2021-04-18 DIAGNOSIS — C61 Malignant neoplasm of prostate: Secondary | ICD-10-CM

## 2021-04-18 NOTE — Progress Notes (Signed)
Please schedule biopsy ASAP.   Please also schedule follow-up in approximately 1 week postbiopsy; MD; no labs-Dr.B

## 2021-04-19 ENCOUNTER — Telehealth: Payer: Self-pay | Admitting: *Deleted

## 2021-04-19 NOTE — Telephone Encounter (Signed)
Spoke with patient- new apts provided for bx and follow-up with Dr. Jacinto Reap on 10/14 at pm  Bx on Wed 10/5 at 9:30a with an arrival date of 8:30a. Pt instructed not to eat/drink anything about midnight prior to the apt. Also instructed to bring a driver.

## 2021-04-19 NOTE — Telephone Encounter (Signed)
Rn contacted pt regarding upcoming bx scheduled on 9/28. Pt states that he is not available these dates as he is on vacation and will not be coming back to town until 10/3. Msg sent back for specialty scheduling to r/s the bx on a different day. Waiting on new apt days.  Pt will need follow-up approx 1 week post bx for results.

## 2021-04-22 ENCOUNTER — Encounter: Payer: Self-pay | Admitting: Internal Medicine

## 2021-04-27 ENCOUNTER — Ambulatory Visit: Payer: Medicare HMO

## 2021-04-28 NOTE — Progress Notes (Signed)
Patient on schedule for Pelvic mass biopsy 05/04/2021, called and spoke with patient oh phone with pre procedure instructions given. Made aware to be here @ 0830, NPO after MN prior to procedure and driver post procedure/recovery/discharge. Stated understanding.

## 2021-05-03 ENCOUNTER — Other Ambulatory Visit: Payer: Self-pay | Admitting: Radiology

## 2021-05-04 ENCOUNTER — Ambulatory Visit
Admission: RE | Admit: 2021-05-04 | Discharge: 2021-05-04 | Disposition: A | Discharge status: Home / Self Care | Payer: Medicare HMO | Source: Ambulatory Visit | Attending: Internal Medicine | Admitting: Internal Medicine

## 2021-05-04 ENCOUNTER — Other Ambulatory Visit: Payer: Self-pay

## 2021-05-04 DIAGNOSIS — R1900 Intra-abdominal and pelvic swelling, mass and lump, unspecified site: Secondary | ICD-10-CM | POA: Insufficient documentation

## 2021-05-04 DIAGNOSIS — C61 Malignant neoplasm of prostate: Secondary | ICD-10-CM

## 2021-05-04 LAB — PROTIME-INR
INR: 1.1 (ref 0.8–1.2)
Prothrombin Time: 13.8 seconds (ref 11.4–15.2)

## 2021-05-04 LAB — PLATELET COUNT: Platelets: 194 10*3/uL (ref 150–400)

## 2021-05-04 MED ORDER — FENTANYL CITRATE (PF) 100 MCG/2ML IJ SOLN
INTRAMUSCULAR | Status: DC | PRN
  Administered 2021-05-04: 50 ug via INTRAVENOUS

## 2021-05-04 MED ORDER — MIDAZOLAM HCL 2 MG/2ML IJ SOLN
INTRAMUSCULAR | Status: DC | PRN
  Administered 2021-05-04: 1 mg via INTRAVENOUS

## 2021-05-04 MED ORDER — HYDROCODONE-ACETAMINOPHEN 5-325 MG PO TABS: 1.0000 | ORAL_TABLET | ORAL | Status: DC | PRN

## 2021-05-04 MED ORDER — MIDAZOLAM HCL 2 MG/2ML IJ SOLN
INTRAMUSCULAR | Status: AC
  Filled 2021-05-04: qty 2

## 2021-05-04 MED ORDER — SODIUM CHLORIDE 0.9 % IV SOLN: INTRAVENOUS | Status: DC

## 2021-05-04 MED ORDER — FENTANYL CITRATE (PF) 100 MCG/2ML IJ SOLN
INTRAMUSCULAR | Status: AC
  Filled 2021-05-04: qty 2

## 2021-05-04 NOTE — Sedation Documentation (Signed)
Medication dose calculated and verified for: see MAR.

## 2021-05-04 NOTE — Sedation Documentation (Signed)
Medication dose calculated and verified for: this procedure

## 2021-05-04 NOTE — Procedures (Signed)
  Procedure: CT core biopsy R retroperitoneal mass   EBL:   minimal Complications:  none immediate  See full dictation in [facility].  Dillard Cannon MD Main # [phone] Pager  [phone]

## 2021-05-04 NOTE — Sedation Documentation (Signed)
Unable to get IR Documentation to accept 'sedation end' and reviewed all other documentation complete.  Reviewed with team RN. Sedation end time of 1012. No adverse outcome.  Returned to [employer] [facility] for recovery.

## 2021-05-06 ENCOUNTER — Telehealth: Payer: Self-pay | Admitting: *Deleted

## 2021-05-06 NOTE — Telephone Encounter (Signed)
Curtis Quinn- patient may take a regular shower.

## 2021-05-06 NOTE — Telephone Encounter (Signed)
Call returned to Mrs Curtis Quinn and informed that he may take a regular shower. She asked if he needs a dressing on area while showering and I advised that if he does want dressing on to be sure to change wet dressing afterwards or that he can shower without dressing either way.

## 2021-05-06 NOTE — Telephone Encounter (Signed)
Wife Malachy Mood called asking if patient can shower or not. He had a CT biopsy 05/04/21 and instructions were to contact doctor about when he can shower. Please advise  Per [facility] "Do not take baths, swim, or use a hot tub until your doctor approves. Ask your doctor if you may take showers. You may only be allowed to take sponge baths."

## 2021-05-09 ENCOUNTER — Encounter: Payer: Self-pay | Admitting: Internal Medicine

## 2021-05-09 LAB — SURGICAL PATHOLOGY

## 2021-05-13 ENCOUNTER — Inpatient Hospital Stay: Payer: Medicare HMO | Attending: Internal Medicine | Admitting: Internal Medicine

## 2021-05-13 ENCOUNTER — Other Ambulatory Visit: Payer: Self-pay

## 2021-05-13 ENCOUNTER — Encounter: Payer: Self-pay | Admitting: Internal Medicine

## 2021-05-13 DIAGNOSIS — C8510 Unspecified B-cell lymphoma, unspecified site: Secondary | ICD-10-CM | POA: Diagnosis not present

## 2021-05-13 DIAGNOSIS — I4891 Unspecified atrial fibrillation: Secondary | ICD-10-CM | POA: Diagnosis not present

## 2021-05-13 DIAGNOSIS — Z7901 Long term (current) use of anticoagulants: Secondary | ICD-10-CM | POA: Diagnosis not present

## 2021-05-13 DIAGNOSIS — Z79899 Other long term (current) drug therapy: Secondary | ICD-10-CM | POA: Diagnosis not present

## 2021-05-13 DIAGNOSIS — Z8546 Personal history of malignant neoplasm of prostate: Secondary | ICD-10-CM | POA: Insufficient documentation

## 2021-05-13 NOTE — Assessment & Plan Note (Signed)
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 -Differential diagnosis includes marginal zone versus lymphoplasmacytic.  #Further classification of the lymphoma is restricted by-core biopsy.  However I would not recommend excisional biopsy given-as this would not change management.  I discussed the natural history of low-grade lymphomas-slow-growing or years.  Studies have shown that treating low-grade lymphoma has not shown any improvement in overall survival.  I would recommend continued surveillance.  We will plan imaging in 6 months; and then maybe once a year.  #Prostate cancer-status post cryoablation; PSA 1.15 [SEP 2022]; August 2022 PSMA PET scan-negative for any bone lesions/prostatic lesion.  Defer to Dr. Bernardo Heater regarding initiation of ADT versus monitoring.  # Right thigh swelling: Since DEC 2021- 5 x7 cm--ultrasound subcutaneous lipoma.  Monitor for now  # A.fib-awaiting to get started on xarelto [facility] witness;Dr.Gollan]; defer to cardiology.  # DISPOSITION: # follow up in 6 months- MD; labs- cbc/cmp;psa; CT CAP- Dr.B '

## 2021-05-13 NOTE — Progress Notes (Signed)
Patient here for results. He reports that he still has pain at the site of the biopsy. He has also been unusually tired and has been sleeping more during the day.

## 2021-05-13 NOTE — Progress Notes (Signed)
[facility] OFFICE PROGRESS NOTE  Patient Care Team: [name], MD as PCP - General (Family Medicine)  Cancer Staging No matching staging information was found for the patient.   Oncology History Overview Note  Recently moved to the area from [place] He brought records from his prior urologist which were copied, scanned into the chart and reviewed Diagnosed with prostate cancer June 2018 with a PSA of 73.  11/12 cores positive prostate biopsy with Gleason 3+3/3+4  MRI showed a 4.5 cm PI-RADS 5 lesion right prostate with extracapsular extension and involvement of the right seminal vesicle Declined radiation therapy Treated with targeted cryoablation with high-dose bicalutamide and [place] x2 years [SEP 2021] He last saw his urologist in [place] November 2021 and PSA was undetectable at <0.04.  Had been off ADT for 6 months at the time of this visit At recent PCP visit his PSA was detectable at 1.67 on 01/03/2021 IMPRESSION: 1. Abnormal tracer avid soft tissue density along the inferior aspect of the right posterior renal fascia, indeterminate. Suspicious for an atypical location of prostate cancer metastasis. Differential considerations include primary soft tissue neoplasm. Consider tissue sampling. 2. Otherwise, no findings of tracer avid primary or metastatic disease. 3. Inferior right upper lobe tracer avid reticulonodular opacity and consolidation, favoring pneumonia. ---------------------------------   DIAGNOSIS:  A. SOFT TISSUE, RIGHT RETROPERITONEAL; CT-GUIDED CORE NEEDLE BIOPSY:  - CD5-NEGATIVE, CD10-NEGATIVE SMALL B-CELL LYMPHOMA.   Comment:  Core biopsy sections display sheets of relatively monomorphic small  lymphocytes with speckled chromatin and scant cytoplasm.  No significant  abnormal population of large lymphocytes is identified. No epithelioid  cell population is present. No definite follicular architecture is  appreciated.    Immunohistochemical studies show biopsy cores to be comprised almost  exclusively of CD20+ B cells, with relatively few background CD3+ T  cells. B cells are positive for BCL2, and negative for CD5, CD10, BCL6,  MUM1, and Cyclin-D1. CD43 staining appears to match T cells.   Flow cytometric studies are significant for a CD5-, CD10- clonal B cell  population, predominantly small size by light scatter. For further  results, see scanned report in [facility].   Taken together, the findings are compatible with involvement by a CD5-,  CD10- mature B cell lymphoma. The differential diagnosis is broad, and  may favor marginal zone lymphoma, but also includes lymphoplasmacytic  lymphoma, OI78 negative follicular lymphoma, or atypical CLL. There is  limited tissue present for ancillary testing. However, excisional biopsy  may be helpful, both for evaluation of overall architecture, as well as  providing sufficient tissue for molecular/cytogenetic testing.  -------------------------------------------------------  S/p Falloff roof  [2007-crushed feet/quivering voice- prior used to talk in church ]; [facility] Witness [reluctant to take]    Prostate cancer ([place])  04/25/2013 Initial Diagnosis   Prostate cancer ([place])   Low grade B-cell lymphoma ([place])  05/13/2021 Initial Diagnosis   Low grade B-cell lymphoma ([place])      HISTORY OF PRESENT ILLNESS: He is accompanied by his wife.  Ambulating independently.  Curtis Quinn 80 y.o.  male pleasant patient above history of prostate cancer; and right pelvic/retroperitoneal mass -is here today with results of the biopsy.     Patient complains of mild pain at the site of biopsy.  Otherwise denies any new shortness of breath or cough.  No chest pain.  Denies any lumps or bumps.   Review of Systems  Constitutional:  Negative for chills, diaphoresis, fever, malaise/fatigue and weight loss.  HENT:  Negative for  nosebleeds and sore throat.   Eyes:  Negative for  double vision.  Respiratory:  Negative for cough, hemoptysis, sputum production, shortness of breath and wheezing.   Cardiovascular:  Negative for chest pain, palpitations, orthopnea and leg swelling.  Gastrointestinal:  Negative for abdominal pain, blood in stool, constipation, diarrhea, heartburn, melena, nausea and vomiting.  Genitourinary:  Negative for dysuria, frequency and urgency.  Musculoskeletal:  Positive for back pain and joint pain.  Skin: Negative.  Negative for itching and rash.  Neurological:  Negative for dizziness, tingling, focal weakness, weakness and headaches.  Endo/Heme/Allergies:  Does not bruise/bleed easily.  Psychiatric/Behavioral:  Negative for depression. The patient is not nervous/anxious and does not have insomnia.      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   Heart murmur    History of kidney stones    Hypercholesteremia    Hypertension    Shingles    Tremor     PAST SURGICAL HISTORY :   Past Surgical History:  Procedure Laterality Date   FRACTURE SURGERY     bil heels-plates and screws   HARDWARE REMOVAL Right    foot   HEEL SPUR SURGERY N/A    TONSILLECTOMY      FAMILY HISTORY :   Family History  Problem Relation Age of Onset   Heart attack Mother    Stroke Father     SOCIAL HISTORY:   Social History   Tobacco Use   Smoking status: Never   Smokeless tobacco: Never  Vaping Use   Vaping Use: Never used  Substance Use Topics   Alcohol use: Yes    Comment: occ wine 2-3 times monthly   Drug use: Never    ALLERGIES:  is allergic to penicillins.  MEDICATIONS:  Current Outpatient Medications  Medication Sig Dispense Refill   metoprolol tartrate (LOPRESSOR) 25 MG tablet Take 1 tablet (25 mg total) by mouth daily as needed (before surgery). 30 tablet 4   simvastatin (ZOCOR) 20 MG tablet Take 1 tablet (20 mg total) by mouth at  bedtime. 90 tablet 3   No current facility-administered medications for this visit.    PHYSICAL EXAMINATION: ECOG PERFORMANCE STATUS: 0 - Asymptomatic  BP 132/69   Pulse 84   Temp 98.9 F (37.2 C) (Oral)   Resp 16   Wt 176 lb (79.8 kg)   SpO2 97%   BMI 23.22 kg/m   Filed Weights   05/13/21 1507  Weight: 176 lb (79.8 kg)    Physical Exam Vitals and nursing note reviewed.  HENT:     Head: Normocephalic and atraumatic.     Mouth/Throat:     Pharynx: Oropharynx is clear.  Eyes:     Extraocular Movements: Extraocular movements intact.     Pupils: Pupils are equal, round, and reactive to light.  Cardiovascular:     Rate and Rhythm: Normal rate and regular rhythm.  Pulmonary:     Comments: Decreased breath sounds bilaterally.  Abdominal:     Palpations: Abdomen is soft.  Musculoskeletal:        General: Normal range of motion.     Cervical back: Normal range of motion.  Skin:    General: Skin is warm.  Neurological:     General: No focal deficit present.     Mental Status: He is alert and oriented to  person, place, and time.  Psychiatric:        Behavior: Behavior normal.        Judgment: Judgment normal.     LABORATORY DATA:  I have reviewed the data as listed    Component Value Date/Time   NA 138 04/12/2021 0937   K 4.0 04/12/2021 0937   CL 104 04/12/2021 0937   CO2 28 04/12/2021 0937   GLUCOSE 89 04/12/2021 0937   BUN 26 (H) 04/12/2021 0937   CREATININE 1.01 04/12/2021 0937   CALCIUM 9.2 04/12/2021 0937   PROT 8.0 04/12/2021 0937   ALBUMIN 4.1 04/12/2021 0937   AST 19 04/12/2021 0937   ALT 11 04/12/2021 0937   ALKPHOS 73 04/12/2021 0937   BILITOT 1.1 04/12/2021 0937   GFRNONAA >60 04/12/2021 0937    No results found for: SPEP, UPEP  Lab Results  Component Value Date   WBC 5.4 04/12/2021   NEUTROABS 3.9 04/12/2021   HGB 12.7 (L) 04/12/2021   HCT 36.9 (L) 04/12/2021   MCV 89.3 04/12/2021   PLT 194 05/04/2021      Chemistry       Component Value Date/Time   NA 138 04/12/2021 0937   K 4.0 04/12/2021 0937   CL 104 04/12/2021 0937   CO2 28 04/12/2021 0937   BUN 26 (H) 04/12/2021 0937   CREATININE 1.01 04/12/2021 0937      Component Value Date/Time   CALCIUM 9.2 04/12/2021 0937   ALKPHOS 73 04/12/2021 0937   AST 19 04/12/2021 0937   ALT 11 04/12/2021 0937   BILITOT 1.1 04/12/2021 0937       RADIOGRAPHIC STUDIES: I have personally reviewed the radiological images as listed and agreed with the findings in the report. No results found.   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 -Differential diagnosis includes marginal zone versus lymphoplasmacytic.  #Further classification of the lymphoma is restricted by-core biopsy.  However I would not recommend excisional biopsy given-as this would not change management.  I discussed the natural history of low-grade lymphomas-slow-growing or years.  Studies have shown that treating low-grade lymphoma has not shown any improvement in overall survival.  I would recommend continued surveillance.  We will plan imaging in 6 months; and then maybe once a year.  #Prostate cancer-status post cryoablation; PSA 1.15 [SEP 2022]; August 2022 PSMA PET scan-negative for any bone lesions/prostatic lesion.  Defer to Dr. Bernardo Heater regarding initiation of ADT versus monitoring.  # Right thigh swelling: Since DEC 2021- 5 x7 cm--ultrasound subcutaneous lipoma.  Monitor for now  # A.fib-awaiting to get started on xarelto [facility] witness;Dr.Gollan]; defer to cardiology.  # DISPOSITION: # follow up in 6 months- MD; labs- cbc/cmp;psa; CT CAP- Dr.B '   Orders Placed This Encounter  Procedures   CT CHEST ABDOMEN PELVIS W CONTRAST    Standing Status:   Future    Standing Expiration Date:   05/13/2022    Order Specific Question:   Preferred imaging location?    Answer:   [facility]    Order  Specific Question:   Radiology Contrast Protocol - do NOT remove file path    Answer:   [email or web address]   CBC with Differential    Standing Status:   Future    Standing Expiration Date:   05/13/2022   Comprehensive metabolic panel    Standing Status:   Future    Standing Expiration Date:   05/13/2022  PSA    Standing Status:   Future    Standing Expiration Date:   05/13/2022   All questions were answered. The patient knows to call the clinic with any problems, questions or concerns.      Cammie Sickle, MD 05/13/2021 4:04 PM

## 2021-06-30 ENCOUNTER — Other Ambulatory Visit: Payer: Self-pay

## 2021-06-30 DIAGNOSIS — C61 Malignant neoplasm of prostate: Secondary | ICD-10-CM

## 2021-07-01 ENCOUNTER — Other Ambulatory Visit: Payer: Self-pay

## 2021-07-01 ENCOUNTER — Other Ambulatory Visit: Payer: Medicare HMO

## 2021-07-01 DIAGNOSIS — C61 Malignant neoplasm of prostate: Secondary | ICD-10-CM

## 2021-07-02 LAB — PSA: Prostate Specific Ag, Serum: 2 ng/mL (ref 0.0–4.0)

## 2021-07-06 ENCOUNTER — Ambulatory Visit: Payer: Medicare HMO | Admitting: Urology

## 2021-07-06 ENCOUNTER — Other Ambulatory Visit: Payer: Self-pay

## 2021-07-06 ENCOUNTER — Encounter: Payer: Self-pay | Admitting: Urology

## 2021-07-06 VITALS — BP 139/72 | HR 79 | Ht 72.0 in | Wt 175.0 lb

## 2021-07-06 DIAGNOSIS — C61 Malignant neoplasm of prostate: Secondary | ICD-10-CM

## 2021-07-06 NOTE — Progress Notes (Signed)
07/06/2021 3:06 PM   Curtis Quinn 12-Sep-1940 [id number]  Referring provider: [name], MD [phone] [address],  [place] [address]  Chief Complaint  Patient presents with   Prostate Cancer    Urologic history:  1.  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) Diagnosed June 2018 while living in [place] PSA 73; MRI with 4.5 cm PI-RADS 5 lesion right prostate with extracapsular extension and involvement of the right seminal vesicle Declined radiation therapy Treated with targeted cryoablation + high-dose bicalutamide and ADT x2 years PSA undetectable <0.03 June 2020 (off ADT 6 months at time of this blood draw Initial visit here June 2022-PSA 1.67 MRI no evidence of pelvic adenopathy or disease in the pelvis PSMA PET abnormal tracer avid soft tissue density measuring 4.1 x 1.7 cm posterior renal fascia lateral to right psoas muscle CT-guided biopsy 05/04/2021 low-grade B-cell lymphoma   HPI: 80 y.o. male presents for follow-up visit.  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 If you have any questions, ask your nurse or doctor.          metoprolol tartrate 25 MG tablet Commonly known as: LOPRESSOR Take 1 tablet (25 mg total)  by mouth daily as needed (before surgery).   simvastatin 20 MG tablet Commonly known as: ZOCOR Take 1 tablet (20 mg total) by mouth at bedtime.        Allergies:  Allergies  Allergen Reactions   Penicillins Other (See Comments)    Hallucinations    Family History: Family History  Problem Relation Age of Onset   Heart attack Mother    Stroke Father     Social History:  reports that he has never smoked. He has never used smokeless tobacco. He reports current alcohol use. He reports that he does not use drugs.   Physical Exam: BP 139/72   Pulse 79   Ht 6' (1.829 m)   Wt 175 lb (79.4 kg)   BMI 23.73 kg/m   Constitutional:  Alert and oriented, No acute distress. HEENT: [place] AT, moist mucus membranes.  Trachea midline, no masses. Cardiovascular: No clubbing, cyanosis, or edema. Respiratory: Normal respiratory effort, no increased work of breathing. Psychiatric: Normal mood and affect.   Assessment & Plan:    1.  Prostate cancer PSA repeated September 2022 was lower at 1.15 however most recent PSA has risen to 2.0 No evidence of metastatic disease on prostate MRI and PSMA Abnormal finding on PSMA was biopsied and low-grade B-cell lymphoma on surveillance Rising PSA is suspicious for  recurrent disease We discussed several management options.  He is asymptomatic and no evidence of metastatic disease and surveillance was discussed He is not interested in any salvage therapies including radiation and further targeted cryoablation which would require repeat biopsy Restarting ADT was also discussed He has elected to continue surveillance for now which is certainly reasonable Follow-up 6 months with a repeat PSA  I spent 30 total minutes on the day of the encounter including pre-visit review of the medical record, face-to-face time with the patient, and post visit ordering of labs/imaging/tests.   Abbie Sons, [place] [address], [place], [place] [address] [phone]

## 2021-09-15 ENCOUNTER — Other Ambulatory Visit: Payer: Self-pay | Admitting: General Surgery

## 2021-09-28 ENCOUNTER — Telehealth: Payer: Self-pay | Admitting: Internal Medicine

## 2021-09-28 ENCOUNTER — Encounter: Payer: Self-pay | Admitting: Internal Medicine

## 2021-09-28 LAB — SURGICAL PATHOLOGY

## 2021-09-28 NOTE — Telephone Encounter (Signed)
Patient left message on answering service. Stated that he missed a call from Dr. Rogue Bussing.  ?

## 2021-09-28 NOTE — Progress Notes (Signed)
Discussed with Dr. Reuel Derby and Dr.Cintron biopsy mass positive for high-grade sarcoma. ? ?I tried to reach the patient regarding the next plan/referral to sarcoma program. However, unable to reach the patient left a voicemail to call us back. ? ?GB

## 2021-09-28 NOTE — Progress Notes (Signed)
I spoke to patient regarding the results of the biopsy/excision of the lower extremity mass positive for sarcoma. Recommend further evaluation at a higher center. Patient prefers [facility]. ? ?Please make a referral to [facility] surgical oncology program.  Dx: sarcoma.Marland Kitchen

## 2021-09-29 ENCOUNTER — Other Ambulatory Visit: Payer: Self-pay

## 2021-09-30 ENCOUNTER — Encounter: Payer: Self-pay | Admitting: Internal Medicine

## 2021-11-10 ENCOUNTER — Ambulatory Visit
Admission: RE | Admit: 2021-11-10 | Discharge: 2021-11-10 | Disposition: A | Discharge status: Home / Self Care | Payer: Medicare HMO | Source: Ambulatory Visit | Attending: Internal Medicine | Admitting: Internal Medicine

## 2021-11-10 DIAGNOSIS — C8510 Unspecified B-cell lymphoma, unspecified site: Secondary | ICD-10-CM | POA: Insufficient documentation

## 2021-11-10 MED ORDER — IOHEXOL 300 MG/ML  SOLN
100.0000 mL | Freq: Once | INTRAMUSCULAR | Status: AC | PRN
  Administered 2021-11-10: 100 mL via INTRAVENOUS

## 2021-11-11 ENCOUNTER — Inpatient Hospital Stay: Payer: Medicare HMO | Admitting: Internal Medicine

## 2021-11-11 ENCOUNTER — Encounter: Payer: Self-pay | Admitting: Internal Medicine

## 2021-11-11 ENCOUNTER — Inpatient Hospital Stay: Payer: Medicare HMO | Attending: Internal Medicine

## 2021-11-11 ENCOUNTER — Other Ambulatory Visit
Admission: RE | Admit: 2021-11-11 | Discharge: 2021-11-11 | Disposition: A | Discharge status: Home / Self Care | Payer: Medicare HMO | Attending: Internal Medicine | Admitting: Internal Medicine

## 2021-11-11 DIAGNOSIS — C4921 Malignant neoplasm of connective and soft tissue of right lower limb, including hip: Secondary | ICD-10-CM | POA: Diagnosis not present

## 2021-11-11 DIAGNOSIS — I4891 Unspecified atrial fibrillation: Secondary | ICD-10-CM

## 2021-11-11 DIAGNOSIS — C8510 Unspecified B-cell lymphoma, unspecified site: Secondary | ICD-10-CM

## 2021-11-11 DIAGNOSIS — Z8546 Personal history of malignant neoplasm of prostate: Secondary | ICD-10-CM

## 2021-11-11 NOTE — Progress Notes (Signed)
I connected with Curtis Quinn on 11/11/21 at  1:45 PM EDT by video enabled telemedicine visit and verified that I am speaking with the correct person using two identifiers.  ?I discussed the limitations, risks, security and privacy concerns of performing an evaluation and management service by telemedicine and the availability of in-person appointments. I also discussed with the patient that there may be a patient responsible charge related to this service. The patient expressed understanding and agreed to proceed.  ? ? ?Other persons participating in the visit and their role in the encounter: RN/medical reconciliation ?Patient?s location: office ?Provider?s location: home ? ?Oncology History Overview Note  ?Recently moved to the area from [place] ?He brought records from his prior urologist which were copied, scanned into the chart and reviewed ?Diagnosed with prostate cancer June 2018 with a PSA of 73.  11/12 cores positive prostate biopsy with Gleason 3+3/3+4  ?MRI showed a 4.5 cm PI-RADS 5 lesion right prostate with extracapsular extension and involvement of the right seminal vesicle ?Declined radiation therapy ?Treated with targeted cryoablation with high-dose bicalutamide and Firmagon x2 years [SEP 2021] ?He last saw his urologist in [place] and PSA was undetectable at <0.04.  Had been off ADT for 6 months at the time of this visit ?At recent PCP visit his PSA was detectable at 1.67 on 01/03/2021 ?IMPRESSION: ?1. Abnormal tracer avid soft tissue density along the inferior ?aspect of the right posterior renal fascia, indeterminate. ?Suspicious for an atypical location of prostate cancer metastasis. ?Differential considerations include primary soft tissue neoplasm. ?Consider tissue sampling. ?2. Otherwise, no findings of tracer avid primary or metastatic ?disease. ?3. Inferior right upper lobe tracer avid reticulonodular opacity and ?consolidation, favoring  pneumonia. ?---------------------------------  ? ?DIAGNOSIS:  ?A. SOFT TISSUE, RIGHT RETROPERITONEAL; CT-GUIDED CORE NEEDLE BIOPSY:  ?- CD5-NEGATIVE, CD10-NEGATIVE SMALL B-CELL LYMPHOMA.  ? ?Comment:  ?Core biopsy sections display sheets of relatively monomorphic small  ?lymphocytes with speckled chromatin and scant cytoplasm.  No significant  ?abnormal population of large lymphocytes is identified. No epithelioid  ?cell population is present. No definite follicular architecture is  ?appreciated.  ? ?Immunohistochemical studies show biopsy cores to be comprised almost  ?exclusively of CD20+ B cells, with relatively few background CD3+ T  ?cells. B cells are positive for BCL2, and negative for CD5, CD10, BCL6,  ?MUM1, and Cyclin-D1. CD43 staining appears to match T cells.  ? ?Flow cytometric studies are significant for a CD5-, CD10- clonal B cell  ?population, predominantly small size by light scatter. For further  ?results, see scanned report in [facility].  ? ?Taken together, the findings are compatible with involvement by a CD5-,  ?CD10- mature B cell lymphoma. The differential diagnosis is broad, and  ?may favor marginal zone lymphoma, but also includes lymphoplasmacytic  ?lymphoma, VZ85 negative follicular lymphoma, or atypical CLL. There is  ?limited tissue present for ancillary testing. However, excisional biopsy  ?may be helpful, both for evaluation of overall architecture, as well as  ?providing sufficient tissue for molecular/cytogenetic testing.  ?------------------------------------------------------- ? ?S/p Falloff roof  [2007-crushed feet/quivering voice- prior used to talk in church ]; Gypsy Dec[employer] Witness [reluctant to take] ? ?  ?Prostate cancer ([place])  ?04/25/2013 Initial Diagnosis  ? Prostate cancer ([place]) ?  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  ? ?Chief Complaint: lymphoma/ prostate cancer/sarcoma  ? ?History of present illness:Curtis Quinn 81  y.o.  male with history of multiple unrelated malignancies including prostate cancer;  lymphoma/sarcoma is here for follow-up. ? 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  Patient was evaluated at [facility] for further evaluation.  Patient after multiple evaluations including MRI-is currently undergoing radiation.  Started radiation at [facility] for his sarcoma today x 5 weeks. ? ?Observation/objective: Alert & oriented x 3. In No acute distress.  ? 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 -Differential diagnosis includes marginal zone versus lymphoplasmacytic. CT CAP April 2023-   Stable 4.1 x 1.6 cm soft tissue mass along the inferior aspect of ?the right posterior renal fascia lateral to the right psoas muscle; otherwise no evidence of any progressive disease/metastatic disease.  Consider imaging 6 months to  12 months.  ? ?#Prostate cancer-status post cryoablation; PSA 1.15 [SEP 2022]; August 2022 [facility]MA PET scan-negative for any bone lesions/prostatic lesion.  PSA December 2022 2.0.  Recommend monitoring for now.  Especially context of his new diagnosis of sarcoma/see below.  ? 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 Peyton Najjar- March 2023-s/p evaluation at [facility]; MRI Postsurgical change status post excision of lateral mid right thigh soft tissue mass. Within the region of resection, there is a 1.5cm residual subcutaneous mass, suspicious for residual myxofibrosarcoma- currently on RT for 5 weeks.  As per patient consideration for surgery post radiation.  ? ?# A.fib-awaiting to get started on xarelto [facility] witness;Dr.Gollan]; defer to cardiology. ? ?# DISPOSITION: ?# cancel labs for today ?# follow up in 3 months- MD; labs- cbc/cmp;psa;- Dr.B ? ? ?Follow-up instructions: ? ?I discussed the assessment and treatment  plan with the patient.  The patient was provided an opportunity to ask questions and all were answered.  The patient agreed with the plan and demonstrated understanding of instructions. ? ?The patient was advised to call back or seek an in person evaluation if the symptoms worsen or if the condition fails to improve as anticipated. ? ? ? ?Dr. Charlaine Dalton ?[facility] ?11/11/2021 ?2:50 PM ?

## 2021-11-11 NOTE — Progress Notes (Signed)
Started radiation at [facility] for his sarcoma today. ? ? ?

## 2021-11-11 NOTE — Assessment & Plan Note (Addendum)
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 -Differential diagnosis includes marginal zone versus lymphoplasmacytic. CT CAP April 2023-   Stable 4.1 x 1.6 cm soft tissue mass along the inferior aspect of ?the right posterior renal fascia lateral to the right psoas muscle; otherwise no evidence of any progressive disease/metastatic disease.  Consider imaging 6 months to  12 months.  ? ?#Prostate cancer-status post cryoablation; PSA 1.15 [SEP 2022]; August 2022 PSMA PET scan-negative for any bone lesions/prostatic lesion.  PSA December 2022 2.0.  Recommend monitoring for now.  Especially context of his new diagnosis of sarcoma/see below.  ? 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 Peyton Najjar- March 2023-s/p evaluation at [facility]; MRI Postsurgical change status post excision of lateral mid right thigh soft tissue mass. Within the?region of resection, there is a 1.5cm residual subcutaneous mass, suspicious for residual myxofibrosarcoma- currently on RT for 5 weeks.  As per patient consideration for surgery post radiation.  ? ?# A.fib-awaiting to get started on xarelto [facility] witness;Dr.Gollan]; defer to cardiology. ? ?# DISPOSITION: ?# cancel labs for today ?# follow up in 3 months- MD; labs- cbc/cmp;psa;- Dr.B ? ?

## 2021-11-20 NOTE — Progress Notes (Signed)
Cardiology Office Note ? ?Date:  11/21/2021  ? ?ID:  Curtis Quinn, DOB 06-09-1941, MRN [id number] ? ?PCP:  Idelle Crouch, MD ? ? ?Chief Complaint  ?Patient presents with  ? 12 month follow up   ?  "Doing well." Medications reviewed by the patient verbally.   ? ? ?HPI:  ?Ledon Weihe is a 81 year old gentleman with past medical history of ?Hypertension  ?hyperlipidemia ?chest pain 2015 work-up at that time with stress test ?Echo: Ejection fraction 50% inferior wall motion abnormality 2015 ?Previously followed at [facility] ?hx of prostate cancer  ?biliary colic at surgery, seen 01/20/2021 for the evaluation of postoperative atrial flutter ?Who presents for office evaluation for his atrial flutter noted postoperatively ? ?Last seen in clinic by myself July 2022 ?Seen in the hospital January 20, 2021 postop atrial flutter for robotic assisted laparoscopic cholecystectomy ?Given Jehovah's Witness, declined anticoagulation ? ?Zio monitor performed confirming atrial fibrillation 3% burden ?Eliquis and metoprolol succinate recommended ? ?Echo 12/22 ?NORMAL LEFT VENTRICULAR SYSTOLIC FUNCTION  ?NORMAL RIGHT VENTRICULAR SYSTOLIC FUNCTION  ?MODERATE VALVULAR REGURGITATION (moderate MR) ?NO VALVULAR STENOSIS  ? 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 ?Having rare palpitations. 5 to 10 min ?Not on blood thinners, "too expensive" ?Declined warfarin ? ?Active, walks dog 5x a day ?Denies shortness of breath or chest pain ? ?EKG personally reviewed by myself on todays visit ?NSR rate 53 bpm,  no ST or T wave changes ? ?Review of prior records, recently moved from [place], ?We have requested and received records from [place] group ? ?Carotid ultrasound January 2018 no hemodynamically significant stenosis ? ?Prior stress test October 2015 for chest pain shortness of breath jaw pain ?Showed " increased chronotropic response  and small area of ischemia" ?Appears he was treated medically with beta-blockers nitrates statin ? ?PMH:   has a past medical history of Arthritis, Cancer ([place]), Epilepsy ([place]), Heart murmur, History of kidney stones, Hypercholesteremia, Hypertension, Shingles, and Tremor. ? ?PSH:    ?Past Surgical History:  ?Procedure Laterality Date  ? FRACTURE SURGERY    ? bil heels-plates and screws  ? HARDWARE REMOVAL Right   ? foot  ? HEEL SPUR SURGERY N/A   ? TONSILLECTOMY    ? ? ?Current Outpatient Medications  ?Medication Sig Dispense Refill  ? metoprolol tartrate (LOPRESSOR) 25 MG tablet Take 1 tablet (25 mg total) by mouth daily as needed (before surgery). 30 tablet 4  ? primidone (MYSOLINE) 50 MG tablet Take 50 mg by mouth in the morning and at bedtime.    ? simvastatin (ZOCOR) 20 MG tablet Take 1 tablet (20 mg total) by mouth at bedtime. 90 tablet 3  ? ?No current facility-administered medications for this visit.  ? ? ? ?Allergies:   Penicillins  ? ?Social History:  The patient  reports that he has never smoked. He has never used smokeless tobacco. He reports current alcohol use. He reports that he does not use drugs.  ? ?Family History:   family history includes Heart attack in his mother; Stroke in his father.  ? ? ?Review of Systems: ?Review of Systems  ?Constitutional: Negative.   ?HENT: Negative.    ?Respiratory: Negative.    ?Cardiovascular:  Positive for palpitations.  ?Gastrointestinal: Negative.   ?Musculoskeletal: Negative.   ?Neurological: Negative.   ?Psychiatric/Behavioral: Negative.    ?All other systems reviewed and are negative. ? ? ?PHYSICAL EXAM: ?VS:  BP 140/82 (BP Location: Left Arm, Patient Position: Sitting, Cuff Size: Normal)   Pulse (!) 53   Ht '6\' 1"'$  (1.854 m)   Wt 184 lb 4 oz (83.6 kg)   SpO2 98%   BMI 24.31 kg/m?  , BMI Body mass index is 24.31 kg/m?. ?GEN: Well nourished, well developed, in no acute distress ?HEENT: normal ?Neck: no JVD, carotid bruits, or masses ?Cardiac: RRR; no  murmurs, rubs, or gallops,no edema  ?Respiratory:  clear to auscultation bilaterally, normal work of breathing ?GI: soft, nontender, nondistended, + BS ?MS: no deformity or atrophy ?Skin: warm and dry, no rash ?Neuro:  Strength and sensation are intact ?Psych: euthymic mood, full affect ? ?Recent Labs: ?04/12/2021: ALT 11; BUN 26; Creatinine, Ser 1.01; Hemoglobin 12.7; Potassium 4.0; Sodium 138 ?05/04/2021: Platelets 194  ? ? ?Lipid Panel ?No results found for: CHOL, HDL, LDLCALC, TRIG ?  ? ?Wt Readings from Last 3 Encounters:  ?11/21/21 184 lb 4 oz (83.6 kg)  ?07/06/21 175 lb (79.4 kg)  ?05/13/21 176 lb (79.8 kg)  ?  ? ? ?ASSESSMENT AND PLAN: ? ?Problem List Items Addressed This Visit   ? ?  ? Cardiology Problems  ? HTN (hypertension)  ? Relevant Orders  ? EKG 12-Lead  ? Hyperlipidemia, unspecified  ? ?Other Visit Diagnoses   ? ? Typical atrial flutter ([facility])    -  Primary  ? Relevant Orders  ? EKG 12-Lead  ? ?  ?Atrial flutter, paroxysmal ?Jehovah's Witness, previously declining anticoagulation ?On metoprolol tartrate as needed, ?Baseline bradycardia rate today 53 bpm ?Recommend he consider a monitoring system such as a Apple Watch ?Discussed that pradaxa may be going generic in the future for his consideration ?He thought others were too expensive ? ?Palpitations/PVCs ?Rare symptoms, takes metoprolol ? ?Prostate cancer ?Stable ? ?Sarcoma ?Of leg, managed at [facility], completing XRT, will need surgery after that he reports ?Acceptable risk for surgery after XRT if needed, no further cardiac testing needed  ? ? Total encounter time more than 30 minutes ? Greater than 50% was spent in counseling and coordination of care with the patient ? ? ?Signed, ?Esmond Plants, M.D., Ph.D. ?[facility] [place], [place] ?[phone] ? ?

## 2021-11-21 ENCOUNTER — Ambulatory Visit: Payer: Medicare HMO | Admitting: Cardiovascular Disease

## 2021-11-21 ENCOUNTER — Encounter: Payer: Self-pay | Admitting: Cardiovascular Disease

## 2021-11-21 VITALS — BP 140/82 | HR 53 | Ht 73.0 in | Wt 184.2 lb

## 2021-11-21 DIAGNOSIS — I483 Typical atrial flutter: Secondary | ICD-10-CM | POA: Diagnosis not present

## 2021-11-21 DIAGNOSIS — I10 Essential (primary) hypertension: Secondary | ICD-10-CM | POA: Diagnosis not present

## 2021-11-21 DIAGNOSIS — E782 Mixed hyperlipidemia: Secondary | ICD-10-CM | POA: Diagnosis not present

## 2021-11-21 NOTE — Patient Instructions (Signed)
Medication Instructions:  No changes  If you need a refill on your cardiac medications before your next appointment, please call your pharmacy.   Lab work: No new labs needed  Testing/Procedures: No new testing needed  Follow-Up: At [facility], you and your health needs are our priority.  As part of our continuing mission to provide you with exceptional heart care, we have created designated Provider Care Teams.  These Care Teams include your primary Cardiologist (physician) and Advanced Practice Providers (APPs -  Physician Assistants and Nurse Practitioners) who all work together to provide you with the care you need, when you need it.  You will need a follow up appointment in 12 months  Providers on your designated Care Team:   Christopher Berge, NP Ryan Dunn, PA-C Cadence Furth, PA-C  COVID-19 Vaccine Information can be found at: [email or web address] For questions related to vaccine distribution or appointments, please email [email or web address] or call [phone].   

## 2022-01-05 ENCOUNTER — Other Ambulatory Visit: Payer: Medicare HMO

## 2022-01-05 ENCOUNTER — Other Ambulatory Visit: Payer: Self-pay

## 2022-01-05 DIAGNOSIS — C61 Malignant neoplasm of prostate: Secondary | ICD-10-CM

## 2022-01-06 LAB — PSA: Prostate Specific Ag, Serum: 4.8 ng/mL — ABNORMAL HIGH (ref 0.0–4.0)

## 2022-01-09 ENCOUNTER — Encounter: Payer: Self-pay | Admitting: Urology

## 2022-01-09 ENCOUNTER — Ambulatory Visit: Payer: Medicare HMO | Admitting: Urology

## 2022-01-09 VITALS — BP 124/66 | HR 76 | Ht 73.0 in | Wt 180.0 lb

## 2022-01-09 DIAGNOSIS — Z8546 Personal history of malignant neoplasm of prostate: Secondary | ICD-10-CM | POA: Diagnosis not present

## 2022-01-09 DIAGNOSIS — C61 Malignant neoplasm of prostate: Secondary | ICD-10-CM

## 2022-01-09 NOTE — Progress Notes (Signed)
01/09/2022 3:10 PM   Curtis Quinn 1941/06/23 [id number]  Referring provider: [name], MD [phone] [place],  [place] [address]  Chief Complaint  Patient presents with   Prostate Cancer    Urologic history:  1.  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) Diagnosed June 2018 while living in [place] PSA 73; MRI with 4.5 cm PI-RADS 5 lesion right prostate with extracapsular extension and involvement of the right seminal vesicle Declined radiation therapy Treated with targeted cryoablation + high-dose bicalutamide and ADT x2 years PSA undetectable <0.03 June 2020 (off ADT 6 months at time of this blood draw Initial visit here June 2022-PSA 1.67 MRI no evidence of pelvic adenopathy or disease in the pelvis PSMA PET abnormal tracer avid soft tissue density measuring 4.1 x 1.7 cm posterior renal fascia lateral to right psoas muscle CT-guided biopsy 05/04/2021 low-grade B-cell lymphoma No evidence of recurrent prostate cancer on PSA   HPI: 81 y.o. male presents for follow-up visit.  No complaints No bothersome LUTS Denies dysuria, gross hematuria Since his last visit he was diagnosed with a right thigh myxofibrosarcoma He underwent radiation and increase size of mass noted on repeat MRI and scheduled for resection later this month PSA 01/05/2022 has increased to 4.8     PMH: Past Medical History:  Diagnosis Date   Arthritis    Cancer ([place])    prostate   Epilepsy ([place])    as a child only   Heart murmur    History of kidney stones    Hypercholesteremia    Hypertension    Shingles    Tremor     Surgical History: Past Surgical History:  Procedure Laterality Date   FRACTURE SURGERY     bil heels-plates and screws   HARDWARE REMOVAL Right    foot   HEEL SPUR SURGERY N/A    TONSILLECTOMY      Home Medications:  Allergies as of 01/09/2022       Reactions   Penicillins Other (See Comments)   Hallucinations        Medication List         Accurate as of January 09, 2022  3:10 PM. If you have any questions, ask your nurse or doctor.          metoprolol tartrate 25 MG tablet Commonly known as: LOPRESSOR Take 1 tablet (25 mg total) by mouth daily as needed (before surgery).   primidone 50 MG tablet Commonly known as: MYSOLINE Take 50 mg by mouth in the morning and at bedtime.   simvastatin 20 MG tablet Commonly known as: ZOCOR Take 1 tablet (20 mg total) by mouth at bedtime.        Allergies:  Allergies  Allergen Reactions   Penicillins Other (See Comments)    Hallucinations    Family History: Family History  Problem Relation Age of Onset   Heart attack Mother    Stroke Father     Social History:  reports that he has never smoked. He has never used smokeless tobacco. He reports current alcohol use. He reports that he does not use drugs.   Physical Exam: BP 124/66   Pulse 76   Ht '6\' 1"'$  (1.854 m)   Wt 180 lb (81.6 kg)   BMI 23.75 kg/m   Constitutional:  Alert and oriented, No acute distress. HEENT: [place] AT, moist mucus membranes.  Trachea midline, no masses. Cardiovascular: No clubbing, cyanosis, or edema. Respiratory: Normal respiratory effort, no increased work of breathing. Psychiatric: Normal  mood and affect.   Assessment & Plan:    1.  Prostate cancer PSA has increased to 4.8 Although no evidence of recurrent disease on PSMA we discussed his rising PSA he is suspicious for recurrent disease He was treated with targeted cryoablation.  He previously refused radiation therapy in the low cryoablation.  Whole gland cryoablation could be a consideration ADT was also discussed He is scheduled to undergo resection of a sarcoma next week and wants to hold off on prostate cancer treatment for now.  He would like a follow-up PSA in 3-6 months   Abbie Sons, MD  [facility] [address] [place], [place] [address] [phone]

## 2022-01-11 ENCOUNTER — Telehealth: Payer: Self-pay | Admitting: Cardiovascular Disease

## 2022-01-11 NOTE — Telephone Encounter (Signed)
Called and spoke with pt's wife (DPR).  Per wife, beginning yesterday pt has had incr HR ranging 90-117, incr tremors, feeling off-balance, began dry cough and now reports pain/pressure behind right eye.   Pt lying in bed at this time. When asked if short of breath, pt states "my breaths feel shallow". Pt has h/o tremors, however, reports they are worse today in head and lips.  Pt denies dizziness, but after standing he has new onset impaired balance.   BP today was checked multiple times: 119/49, 121/57, 133/53  Pt does have h/o atrial flutter. Has Lopressor 25 mg daily PRN (before surgery) on hand.  Wife reports that pt is now also taking two Aspirin 81 mg daily.   Per wife, pt normally walks without any assistance, steady gait is baseline.  Advised pt d/t new onset balance changes, pressure in right eye, tremors, breathing changes included w/ incr HR to go to the ER. Pt refuses to go to the ER or call 911.   Pt is aware that he may take Lopressor 25 mg as needed for elevated HR.   Pt's wife states that she will give Lopressor for incr HR now, and will monitor pt.  Wife states that she will call 911 if s/s worsen.   Scheduled appt for pt to see Dr. Rockey Situ 01/16/22 at 3:20. Pt aware to arrive 15 min prior.

## 2022-01-11 NOTE — Telephone Encounter (Signed)
STAT if HR is under 50 or over 120 (normal HR is 60-100 beats per minute)  What is your heart rate? 102  Do you have a log of your heart rate readings (document readings)?  118 115 117 90 102  Do you have any other symptoms? Per pts wife, he has no energy, shaky and seems off balance. She also states that he has a deep cough.

## 2022-01-15 NOTE — Progress Notes (Unsigned)
Cardiology Office Note  Date:  01/16/2022   ID:  Curtis Quinn, DOB 05/07/1941, MRN [id number]  PCP:  Idelle Crouch, MD   Chief Complaint  Patient presents with   Cardiac clearance     Patient needs to have a skin cancer removed. Patient c/o fluttering in chest with racing heart beats. Medications reviewed by the patient verbally.     HPI:  Curtis Quinn is a 81 year old gentleman with past medical history of Hypertension  hyperlipidemia chest pain 2015 work-up at that time with stress test Echo: Ejection fraction 50% inferior wall motion abnormality 2015 Previously followed at [facility] hx of prostate cancer  biliary colic at surgery, seen 01/20/2021 for the evaluation of postoperative atrial flutter Given Jehovah's Witness, declined anticoagulation Who presents for office evaluation for his atrial flutter noted postoperatively  Last seen in clinic by myself 4/23 Needs surgery on leg for sarcoma right leg, s/p XRT excised 09/15/21 at [facility] with positive margins, pathology returned as myxofibrosarcoma, high grade, mass measured 3.8x3.1x1.2 cm. Planned for 01/25/22 at [facility], Dr. Cherre Blanc  Last Tuesday, working in garden, spraying hydrogen peroxide to kill fungus Had a nap, woke with sore throat Trouble walking and seeing, noted elevated heart rate, possible atrial fibrillation Was very anxious at the time, concerned about elevated PSA, pending surgeries, cancer treatments  Reports he has not had to take metoprolol, prescribed for paroxysmal tachycardia  Lab work reviewed PSA climbing, 4.8 Total chol 176, LDL 110  EKG personally reviewed by myself on todays visit Sinus bradycardia rate 58 bpm no significant ST-T wave changes  Past medical history reviewed Prior hospital evaluation January 20, 2021 postop atrial flutter for robotic assisted laparoscopic cholecystectomy  Zio monitor performed confirming atrial fibrillation 3% burden Eliquis and metoprolol succinate  recommended  Echo 12/22 NORMAL LEFT VENTRICULAR SYSTOLIC FUNCTION  NORMAL RIGHT VENTRICULAR SYSTOLIC FUNCTION  MODERATE VALVULAR REGURGITATION (moderate MR) NO VALVULAR STENOSIS   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  PSH:    Past Surgical History:  Procedure Laterality Date   FRACTURE SURGERY     bil heels-plates and screws   HARDWARE REMOVAL Right    foot   HEEL SPUR SURGERY N/A    TONSILLECTOMY      Current Outpatient Medications  Medication Sig Dispense Refill   primidone (MYSOLINE) 50 MG tablet Take 50 mg by mouth in the morning and at bedtime.     simvastatin (ZOCOR) 20 MG tablet Take 1 tablet (20 mg total) by mouth at bedtime. 90 tablet 3   metoprolol tartrate (LOPRESSOR) 25 MG tablet Take 1 tablet (25 mg total) by mouth daily as needed (before surgery). (Patient not taking: Reported on 01/16/2022) 30 tablet 4   No current facility-administered medications for this visit.    Allergies:   Penicillins   Social History:  The patient  reports that he has never smoked. He has never used smokeless tobacco. He reports current alcohol use. He reports that he does not use drugs.   Family History:   family history includes  Heart attack in his mother; Stroke in his father.    Review of Systems: Review of Systems  Constitutional: Negative.   HENT: Negative.    Respiratory: Negative.    Cardiovascular:  Positive for palpitations.  Gastrointestinal: Negative.    Musculoskeletal: Negative.   Neurological: Negative.   Psychiatric/Behavioral: Negative.    All other systems reviewed and are negative.    PHYSICAL EXAM: VS:  BP 130/60 (BP Location: Left Arm, Patient Position: Sitting, Cuff Size: Normal)   Pulse (!) 58   Ht 6' (1.829 m)   Wt 179 lb 8 oz (81.4 kg)   BMI 24.34 kg/m  , BMI Body mass index is 24.34 kg/m. Constitutional:  oriented to person, place, and time. No distress.  HENT:  Head: Grossly normal Eyes:  no discharge. No scleral icterus.  Neck: No JVD, no carotid bruits  Cardiovascular: Regular rate and rhythm, no murmurs appreciated Pulmonary/Chest: Clear to auscultation bilaterally, no wheezes or rails Abdominal: Soft.  no distension.  no tenderness.  Musculoskeletal: Normal range of motion Neurological:  normal muscle tone. Coordination normal. No atrophy Skin: Skin warm and dry Psychiatric: normal affect, pleasant  Recent Labs: 04/12/2021: ALT 11; BUN 26; Creatinine, Ser 1.01; Hemoglobin 12.7; Potassium 4.0; Sodium 138 05/04/2021: Platelets 194    Lipid Panel No results found for: "CHOL", "HDL", "LDLCALC", "TRIG"    Wt Readings from Last 3 Encounters:  01/16/22 179 lb 8 oz (81.4 kg)  01/09/22 180 lb (81.6 kg)  11/21/21 184 lb 4 oz (83.6 kg)     ASSESSMENT AND PLAN:  Problem List Items Addressed This Visit       Cardiology Problems   HTN (hypertension)   Hyperlipidemia, unspecified   Other Visit Diagnoses     Typical atrial flutter (Rafael Gonzalez)    -  Primary     Preop cardiovascular evaluation Sar[facility] surguery next [facility] Acceptable risk from a cardiac perspective No further cardiac testing needed  Atrial flutter, paroxysmal Jehovah's Witness, previously declining anticoagulation Baseline bradycardia has persisted Previously discussed Apple Watch for monitoring system He reports that he has a pulse oximeter Unable to exclude recent episode of flutter in the setting of stress, recommend he take  metoprolol as needed for breakthrough episodes  Palpitations/PVCs Rare symptoms, metoprolol as above for symptoms  Prostate cancer He is concerned about climbing PSA number  Sarcoma Completed XRT, plan for surgery next week   Total encounter time more than 30 minutes  Greater than 50% was spent in counseling and coordination of care with the patient   Signed, Esmond Plants, M.D., Ph.D. [place], [place]

## 2022-01-16 ENCOUNTER — Encounter: Payer: Self-pay | Admitting: Cardiovascular Disease

## 2022-01-16 ENCOUNTER — Ambulatory Visit: Payer: Medicare HMO | Admitting: Cardiovascular Disease

## 2022-01-16 VITALS — BP 130/60 | HR 58 | Ht 72.0 in | Wt 179.5 lb

## 2022-01-16 DIAGNOSIS — E782 Mixed hyperlipidemia: Secondary | ICD-10-CM

## 2022-01-16 DIAGNOSIS — I10 Essential (primary) hypertension: Secondary | ICD-10-CM | POA: Diagnosis not present

## 2022-01-16 DIAGNOSIS — I483 Typical atrial flutter: Secondary | ICD-10-CM | POA: Diagnosis not present

## 2022-01-16 NOTE — Patient Instructions (Signed)
Medication Instructions:  No changes  If you need a refill on your cardiac medications before your next appointment, please call your pharmacy.   Lab work: No new labs needed  Testing/Procedures: No new testing needed  Follow-Up: At [facility], you and your health needs are our priority.  As part of our continuing mission to provide you with exceptional heart care, we have created designated Provider Care Teams.  These Care Teams include your primary Cardiologist (physician) and Advanced Practice Providers (APPs -  Physician Assistants and Nurse Practitioners) who all work together to provide you with the care you need, when you need it.  You will need a follow up appointment in 12 months  Providers on your designated Care Team:   Christopher Berge, NP Ryan Dunn, PA-C Cadence Furth, PA-C  COVID-19 Vaccine Information can be found at: [email or web address] For questions related to vaccine distribution or appointments, please email [email or web address] or call [phone].   

## 2022-01-25 HISTORY — PX: OTHER SURGICAL HISTORY: SHX169

## 2022-02-15 ENCOUNTER — Inpatient Hospital Stay: Payer: Medicare HMO | Attending: Internal Medicine

## 2022-02-15 ENCOUNTER — Encounter: Payer: Self-pay | Admitting: Internal Medicine

## 2022-02-15 ENCOUNTER — Inpatient Hospital Stay: Payer: Medicare HMO | Admitting: Internal Medicine

## 2022-02-15 VITALS — BP 142/75 | HR 81 | Temp 98.3°F | Ht 72.0 in | Wt 174.9 lb

## 2022-02-15 DIAGNOSIS — I10 Essential (primary) hypertension: Secondary | ICD-10-CM | POA: Diagnosis not present

## 2022-02-15 DIAGNOSIS — C61 Malignant neoplasm of prostate: Secondary | ICD-10-CM | POA: Insufficient documentation

## 2022-02-15 DIAGNOSIS — C8510 Unspecified B-cell lymphoma, unspecified site: Secondary | ICD-10-CM | POA: Diagnosis not present

## 2022-02-15 DIAGNOSIS — C8300 Small cell B-cell lymphoma, unspecified site: Secondary | ICD-10-CM | POA: Insufficient documentation

## 2022-02-15 LAB — CBC WITH DIFFERENTIAL/PLATELET
Abs Immature Granulocytes: 0 10*3/uL (ref 0.00–0.07)
Basophils Absolute: 0 10*3/uL (ref 0.0–0.1)
Basophils Relative: 1 %
Eosinophils Absolute: 0 10*3/uL (ref 0.0–0.5)
Eosinophils Relative: 0 %
HCT: 36.8 % — ABNORMAL LOW (ref 39.0–52.0)
Hemoglobin: 12.3 g/dL — ABNORMAL LOW (ref 13.0–17.0)
Immature Granulocytes: 0 %
Lymphocytes Relative: 27 %
Lymphs Abs: 1.1 10*3/uL (ref 0.7–4.0)
MCH: 31.1 pg (ref 26.0–34.0)
MCHC: 33.4 g/dL (ref 30.0–36.0)
MCV: 92.9 fL (ref 80.0–100.0)
Monocytes Absolute: 0.3 10*3/uL (ref 0.1–1.0)
Monocytes Relative: 7 %
Neutro Abs: 2.7 10*3/uL (ref 1.7–7.7)
Neutrophils Relative %: 65 %
Platelets: 212 10*3/uL (ref 150–400)
RBC: 3.96 MIL/uL — ABNORMAL LOW (ref 4.22–5.81)
RDW: 13.3 % (ref 11.5–15.5)
WBC: 4.1 10*3/uL (ref 4.0–10.5)
nRBC: 0 % (ref 0.0–0.2)

## 2022-02-15 LAB — COMPREHENSIVE METABOLIC PANEL
ALT: 9 U/L (ref 0–44)
AST: 17 U/L (ref 15–41)
Albumin: 4 g/dL (ref 3.5–5.0)
Alkaline Phosphatase: 74 U/L (ref 38–126)
Anion gap: 5 (ref 5–15)
BUN: 19 mg/dL (ref 8–23)
CO2: 29 mmol/L (ref 22–32)
Calcium: 8.8 mg/dL — ABNORMAL LOW (ref 8.9–10.3)
Chloride: 104 mmol/L (ref 98–111)
Creatinine, Ser: 1 mg/dL (ref 0.61–1.24)
GFR, Estimated: >60 mL/min (ref >60)
Glucose, Bld: 98 mg/dL (ref 70–99)
Potassium: 4.3 mmol/L (ref 3.5–5.1)
Sodium: 138 mmol/L (ref 135–145)
Total Bilirubin: 0.6 mg/dL (ref 0.3–1.2)
Total Protein: 7.6 g/dL (ref 6.5–8.1)

## 2022-02-15 LAB — PSA: Prostatic Specific Antigen: 6.42 ng/mL — ABNORMAL HIGH (ref 0.00–4.00)

## 2022-02-15 NOTE — Assessment & Plan Note (Addendum)
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 -Differential diagnosis includes marginal zone versus lymphoplasmacytic. CT CAP April 2023-   Stable 4.1 x 1.6 cm soft tissue mass along the inferior aspect of the right posterior renal fascia lateral to the right psoas muscle; otherwise no evidence of any progressive disease/metastatic disease. Will repeat CT of AP in 3 months.  Discussed the options of rituximab weekly x4 versus radiation.  Continue monitoring for now.  #Prostate cancer-status post cryoablation; PSA 1.15 [SEP 2022]; August 2022 [facility] PET scan-negative for any bone lesions/prostatic lesion.  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 Kateri Mc - 5000 cGy, 4/13-5/18/2023;  Surgical resection of right thigh mass with by Dr. Cherre Blanc on 01/25/2022. Path = Residual myxofibrosarcoma, grade 2 or 3, negative margins.  Currently under surveillance as per D.  # ParoxyamllA.fib--decline xarelto [IBBCWUG witness;Dr.Gollan]; not on anticoagulation.  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  # mole of right ear- [new in last year]- recommend evaluation with dermatology. Pt prefers referral Dr.Sparks.   # Genetic counselling: three separate primaries; youngest daughter diagnosed with melanoma discussed unlikely that patient has any genetic predisposition.  However, given multiple malignancies reasonable to have genetic evaluation.  Patient wants to hold off for now.   # DISPOSITION: # # follow up in 3 months- MD; labs- cbc/cmp;psa;CT AP prior- Dr.B

## 2022-02-15 NOTE — Progress Notes (Signed)
Pt states saw Dr. Bernardo Heater, psa in increasing.  C/o of extremities getting cold worse since first of this year.

## 2022-02-15 NOTE — Progress Notes (Signed)
[place] OFFICE PROGRESS NOTE  Quinn Care Team: Idelle Crouch, MD as PCP - General (Internal Medicine) Cammie Sickle, MD as Consulting Physician (Oncology)   Cancer Staging  No matching staging information was found for the Quinn.   Oncology History Overview Note  Recently moved to the area from [place] He brought records from his prior urologist which were copied, scanned into the chart and reviewed Diagnosed with prostate cancer June 2018 with a PSA of 73.  11/12 cores positive prostate biopsy with Gleason 3+3/3+4  MRI showed a 4.5 cm PI-RADS 5 lesion right prostate with extracapsular extension and involvement of the right seminal vesicle Declined radiation therapy Treated with targeted cryoablation with high-dose bicalutamide and [place] Kol[place] x2 years [SEP 2021] He last saw his urologist in [place] November 2021 and PSA was undetectable at <0.04.  Had been off ADT for 6 months at the time of this visit At recent PCP visit his PSA was detectable at 1.67 on 01/03/2021 IMPRESSION: 1. Abnormal tracer avid soft tissue density along the inferior aspect of the right posterior renal fascia, indeterminate. Suspicious for an atypical location of prostate cancer metastasis. Differential considerations include primary soft tissue neoplasm. Consider tissue sampling. 2. Otherwise, no findings of tracer avid primary or metastatic disease. 3. Inferior right upper lobe tracer avid reticulonodular opacity and consolidation, favoring pneumonia. ---------------------------------   DIAGNOSIS:  A. SOFT TISSUE, RIGHT RETROPERITONEAL; CT-GUIDED CORE NEEDLE BIOPSY:  - CD5-NEGATIVE, CD10-NEGATIVE SMALL B-CELL LYMPHOMA.   Comment:  Core biopsy sections display sheets of relatively monomorphic small  lymphocytes with speckled chromatin and scant cytoplasm.  No significant  abnormal population of large lymphocytes is identified. No epithelioid  cell population is present.  No definite follicular architecture is  appreciated.   Immunohistochemical studies show biopsy cores to be comprised almost  exclusively of CD20+ B cells, with relatively few background CD3+ T  cells. B cells are positive for BCL2, and negative for CD5, CD10, BCL6,  MUM1, and Cyclin-D1. CD43 staining appears to match T cells.   Flow cytometric studies are significant for a CD5-, CD10- clonal B cell  population, predominantly small size by light scatter. For further  results, see scanned report in [facility].   Taken together, the findings are compatible with involvement by a CD5-,  CD10- mature B cell lymphoma. The differential diagnosis is broad, and  may favor marginal zone lymphoma, but also includes lymphoplasmacytic  lymphoma, ZO10 negative follicular lymphoma, or atypical CLL. There is  limited tissue present for ancillary testing. However, excisional biopsy  may be helpful, both for evaluation of overall architecture, as well as  providing sufficient tissue for molecular/cytogenetic testing.  -------------------------------------------------------  [facility] 2023- Right thigh myxofibrosarcoma [incidental status post resection of a possible lipoma Dr. Peyton Najjar- March 2023-s/p evaluation at [facility]; MRI Postsurgical change status post excision of lateral mid right thigh soft tissue mass. Within the region of resection, there is a 1.5cm residual subcutaneous mass, suspicious for residual myxofibrosarcoma- currently os/p RT for 5 weeks.  Status post wide resection at [facility] 2023]-on surveillance   S/p Falloff roof  [2007-crushed feet/quivering voice- prior used to talk in church ]; Gypsy Decant Witness [reluctant to take]    Prostate cancer ([name])  04/25/2013 Initial Diagnosis   Prostate cancer ([name])   Low grade B-cell lymphoma ([name])  05/13/2021 Initial Diagnosis   Low grade B-cell lymphoma ([name])      HISTORY OF PRESENT ILLNESS: He is accompanied by his wife.  Ambulating  independently.  Curtis Quince  Quinn 81 y.o.  male pleasant Quinn above history of prostate cancer; and right pelvic/retroperitoneal mass-low-grade lymphoma; and high sarcoma is here for follow-up.  Quinn underwent neoadjuvant radiation followed by surgery for his sarcoma of the leg/right thigh.  Quinn is healing well from surgery.  He is able to ambulate independently.  Otherwise denies any new shortness of breath or cough.  No chest pain.  Denies any lumps or bumps.   Review of Systems  Constitutional:  Negative for chills, diaphoresis, fever, malaise/fatigue and weight loss.  HENT:  Negative for nosebleeds and sore throat.   Eyes:  Negative for double vision.  Respiratory:  Negative for cough, hemoptysis, sputum production, shortness of breath and wheezing.   Cardiovascular:  Negative for chest pain, palpitations, orthopnea and leg swelling.  Gastrointestinal:  Negative for abdominal pain, blood in stool, constipation, diarrhea, heartburn, melena, nausea and vomiting.  Genitourinary:  Negative for dysuria, frequency and urgency.  Musculoskeletal:  Positive for back pain and joint pain.  Skin: Negative.  Negative for itching and rash.  Neurological:  Negative for dizziness, tingling, focal weakness, weakness and headaches.  Endo/Heme/Allergies:  Does not bruise/bleed easily.  Psychiatric/Behavioral:  Negative for depression. The Quinn is not nervous/anxious and does not have insomnia.       PAST MEDICAL HISTORY :  Past Medical History:  Diagnosis Date   Arthritis    Cancer ([place])    prostate   Epilepsy ([place])    as a child only   Heart murmur    History of kidney stones    Hypercholesteremia    Hypertension    Shingles    Tremor     PAST SURGICAL HISTORY :   Past Surgical History:  Procedure Laterality Date   FRACTURE SURGERY     bil heels-plates and screws   HARDWARE REMOVAL Right    foot   HEEL SPUR SURGERY N/A    sarcoma remvoal leg Right 01/25/2022   [facility]    TONSILLECTOMY      FAMILY HISTORY :   Family History  Problem Relation Age of Onset   Heart attack Mother    Stroke Father     SOCIAL HISTORY:   Social History   Tobacco Use   Smoking status: Never   Smokeless tobacco: Never  Vaping Use   Vaping Use: Never used  Substance Use Topics   Alcohol use: Yes    Comment: occ wine 2-3 times monthly   Drug use: Never    ALLERGIES:  is allergic to penicillins.  MEDICATIONS:  Current Outpatient Medications  Medication Sig Dispense Refill   aspirin EC 81 MG tablet Take by mouth.     primidone (MYSOLINE) 50 MG tablet Take 50 mg by mouth in the morning and at bedtime.     simvastatin (ZOCOR) 20 MG tablet Take 1 tablet (20 mg total) by mouth at bedtime. 90 tablet 3   metoprolol tartrate (LOPRESSOR) 25 MG tablet Take 1 tablet (25 mg total) by mouth daily as needed (before surgery). (Quinn not taking: Reported on 01/16/2022) 30 tablet 4   No current facility-administered medications for this visit.    PHYSICAL EXAMINATION: ECOG PERFORMANCE STATUS: 0 - Asymptomatic  BP (!) 142/75 (BP Location: Left Arm, Quinn Position: Sitting, Cuff Size: Normal)   Pulse 81   Temp 98.3 F (36.8 C) (Tympanic)   Ht 6' (1.829 m)   Wt 174 lb 14.4 oz (79.3 kg)   SpO2 98%   BMI 23.72 kg/m   [employer]  02/15/22 1321  Weight: 174 lb 14.4 oz (79.3 kg)    Physical Exam Vitals and nursing note reviewed.  HENT:     Head: Normocephalic and atraumatic.     Mouth/Throat:     Pharynx: Oropharynx is clear.  Eyes:     Extraocular Movements: Extraocular movements intact.     Pupils: Pupils are equal, round, and reactive to light.  Cardiovascular:     Rate and Rhythm: Normal rate and regular rhythm.  Pulmonary:     Comments: Decreased breath sounds bilaterally.  Abdominal:     Palpations: Abdomen is soft.  Musculoskeletal:        General: Normal range of motion.     Cervical back: Normal range of motion.  Skin:    General: Skin is warm.   Neurological:     General: No focal deficit present.     Mental Status: He is alert and oriented to person, place, and time.  Psychiatric:        Behavior: Behavior normal.        Judgment: Judgment normal.      LABORATORY DATA:  I have reviewed the data as listed    Component Value Date/Time   NA 138 02/15/2022 1312   K 4.3 02/15/2022 1312   CL 104 02/15/2022 1312   CO2 29 02/15/2022 1312   GLUCOSE 98 02/15/2022 1312   BUN 19 02/15/2022 1312   CREATININE 1.00 02/15/2022 1312   CALCIUM 8.8 (L) 02/15/2022 1312   PROT 7.6 02/15/2022 1312   ALBUMIN 4.0 02/15/2022 1312   AST 17 02/15/2022 1312   ALT 9 02/15/2022 1312   ALKPHOS 74 02/15/2022 1312   BILITOT 0.6 02/15/2022 1312   GFRNONAA >60 02/15/2022 1312    No results found for: "SPEP", "UPEP"  Lab Results  Component Value Date   WBC 4.1 02/15/2022   NEUTROABS 2.7 02/15/2022   HGB 12.3 (L) 02/15/2022   HCT 36.8 (L) 02/15/2022   MCV 92.9 02/15/2022   PLT 212 02/15/2022      Chemistry      Component Value Date/Time   NA 138 02/15/2022 1312   K 4.3 02/15/2022 1312   CL 104 02/15/2022 1312   CO2 29 02/15/2022 1312   BUN 19 02/15/2022 1312   CREATININE 1.00 02/15/2022 1312      Component Value Date/Time   CALCIUM 8.8 (L) 02/15/2022 1312   ALKPHOS 74 02/15/2022 1312   AST 17 02/15/2022 1312   ALT 9 02/15/2022 1312   BILITOT 0.6 02/15/2022 1312       RADIOGRAPHIC STUDIES: I have personally reviewed the radiological images as listed and agreed with the findings in the report. No results found.   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 -Differential diagnosis includes marginal zone versus lymphoplasmacytic. CT CAP April 2023-   Stable 4.1 x 1.6 cm soft tissue mass along the inferior aspect of the right posterior renal fascia lateral to the right psoas muscle; otherwise no evidence of any progressive  disease/metastatic disease. Will repeat CT of AP in 3 months.  Discussed the options of rituximab weekly x4 versus radiation.  Continue monitoring for now.  #Prostate cancer-status post cryoablation; PSA 1.15 [SEP 2022]; August 2022 [facility] PET scan-negative for any bone lesions/prostatic lesion.  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 Kateri Mc - 5000 cGy, 4/13-5/18/2023;  Surgical resection of  right thigh mass with by Dr. Cherre Blanc on 01/25/2022. Path = Residual myxofibrosarcoma, grade 2 or 3, negative margins.  Currently under surveillance as per D.  # ParoxyamllA.fib--decline xarelto [UVOZDGU witness;Dr.Gollan]; not on anticoagulation.  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  # mole of right ear- [new in last year]- recommend evaluation with dermatology. Pt prefers referral Dr.Sparks.   # Genetic counselling: three separate primaries; youngest daughter diagnosed with melanoma discussed unlikely that Quinn has any genetic predisposition.  However, given multiple malignancies reasonable to have genetic evaluation.  Quinn wants to hold off for now.   # DISPOSITION: # # follow up in 3 months- MD; labs- cbc/cmp;psa;CT AP prior- Dr.B    Orders Placed This Encounter  Procedures   CT ABDOMEN PELVIS W CONTRAST    Standing Status:   Future    Standing Expiration Date:   02/16/2023    Order Specific Question:   If indicated for the ordered procedure, I authorize the administration of contrast media per Radiology protocol    Answer:   Yes    Order Specific Question:   Preferred imaging location?    Answer:   [facility]    Order Specific Question:   Radiology Contrast Protocol - do NOT remove file path    Answer:   [email or web address]   CBC with Differential/Platelet    Standing Status:   Future    Standing Expiration Date:   02/16/2023    Comprehensive metabolic panel    Standing Status:   Future    Standing Expiration Date:   02/16/2023   PSA    Standing Status:   Future    Standing Expiration Date:   02/16/2023   All questions were answered. The Quinn knows to call the clinic with any problems, questions or concerns.      Cammie Sickle, MD 02/15/2022 8:53 PM

## 2022-02-17 ENCOUNTER — Telehealth: Payer: Self-pay | Admitting: Internal Medicine

## 2022-02-17 NOTE — Telephone Encounter (Signed)
On 7/20-I tried to reach patient regarding slightly elevated/PSA at 6 previously around 4.  Left a voicemail.  Recommend patient reach out to Dr. Bernardo Heater regarding further management.  Consider continued surveillance versus Eligard injections after discussion with Dr. Assunta Curtis patient's complicated current current oncologic history.   Thanks,   FYI-Dr.Stoioff.

## 2022-05-15 ENCOUNTER — Ambulatory Visit
Admission: RE | Admit: 2022-05-15 | Discharge: 2022-05-15 | Disposition: A | Discharge status: Home / Self Care | Payer: Medicare HMO | Source: Ambulatory Visit | Attending: Internal Medicine | Admitting: Internal Medicine

## 2022-05-15 DIAGNOSIS — C8510 Unspecified B-cell lymphoma, unspecified site: Secondary | ICD-10-CM | POA: Diagnosis present

## 2022-05-15 LAB — POCT I-STAT CREATININE: Creatinine, Ser: 1 mg/dL (ref 0.61–1.24)

## 2022-05-15 MED ORDER — IOHEXOL 300 MG/ML  SOLN
100.0000 mL | Freq: Once | INTRAMUSCULAR | Status: AC | PRN
  Administered 2022-05-15: 100 mL via INTRAVENOUS

## 2022-05-18 ENCOUNTER — Encounter: Payer: Self-pay | Admitting: Internal Medicine

## 2022-05-18 ENCOUNTER — Inpatient Hospital Stay: Payer: Medicare HMO | Attending: Internal Medicine

## 2022-05-18 ENCOUNTER — Inpatient Hospital Stay: Payer: Medicare HMO | Admitting: Internal Medicine

## 2022-05-18 DIAGNOSIS — I480 Paroxysmal atrial fibrillation: Secondary | ICD-10-CM | POA: Diagnosis not present

## 2022-05-18 DIAGNOSIS — C8510 Unspecified B-cell lymphoma, unspecified site: Secondary | ICD-10-CM

## 2022-05-18 DIAGNOSIS — Z8546 Personal history of malignant neoplasm of prostate: Secondary | ICD-10-CM | POA: Insufficient documentation

## 2022-05-18 DIAGNOSIS — I10 Essential (primary) hypertension: Secondary | ICD-10-CM | POA: Insufficient documentation

## 2022-05-18 DIAGNOSIS — C8303 Small cell B-cell lymphoma, intra-abdominal lymph nodes: Secondary | ICD-10-CM | POA: Diagnosis not present

## 2022-05-18 LAB — PSA: Prostatic Specific Antigen: 10.17 ng/mL — ABNORMAL HIGH (ref 0.00–4.00)

## 2022-05-18 LAB — COMPREHENSIVE METABOLIC PANEL
ALT: 9 U/L (ref 0–44)
AST: 18 U/L (ref 15–41)
Albumin: 3.9 g/dL (ref 3.5–5.0)
Alkaline Phosphatase: 69 U/L (ref 38–126)
Anion gap: 8 (ref 5–15)
BUN: 22 mg/dL (ref 8–23)
CO2: 27 mmol/L (ref 22–32)
Calcium: 9 mg/dL (ref 8.9–10.3)
Chloride: 104 mmol/L (ref 98–111)
Creatinine, Ser: 0.98 mg/dL (ref 0.61–1.24)
GFR, Estimated: >60 mL/min (ref >60)
Glucose, Bld: 91 mg/dL (ref 70–99)
Potassium: 4.2 mmol/L (ref 3.5–5.1)
Sodium: 139 mmol/L (ref 135–145)
Total Bilirubin: 0.6 mg/dL (ref 0.3–1.2)
Total Protein: 7.7 g/dL (ref 6.5–8.1)

## 2022-05-18 LAB — CBC WITH DIFFERENTIAL/PLATELET
Abs Immature Granulocytes: 0.01 10*3/uL (ref 0.00–0.07)
Basophils Absolute: 0 10*3/uL (ref 0.0–0.1)
Basophils Relative: 0 %
Eosinophils Absolute: 0 10*3/uL (ref 0.0–0.5)
Eosinophils Relative: 0 %
HCT: 38 % — ABNORMAL LOW (ref 39.0–52.0)
Hemoglobin: 12.8 g/dL — ABNORMAL LOW (ref 13.0–17.0)
Immature Granulocytes: 0 %
Lymphocytes Relative: 22 %
Lymphs Abs: 1 10*3/uL (ref 0.7–4.0)
MCH: 30.9 pg (ref 26.0–34.0)
MCHC: 33.7 g/dL (ref 30.0–36.0)
MCV: 91.8 fL (ref 80.0–100.0)
Monocytes Absolute: 0.3 10*3/uL (ref 0.1–1.0)
Monocytes Relative: 7 %
Neutro Abs: 3.1 10*3/uL (ref 1.7–7.7)
Neutrophils Relative %: 71 %
Platelets: 184 10*3/uL (ref 150–400)
RBC: 4.14 MIL/uL — ABNORMAL LOW (ref 4.22–5.81)
RDW: 12.6 % (ref 11.5–15.5)
WBC: 4.4 10*3/uL (ref 4.0–10.5)
nRBC: 0 % (ref 0.0–0.2)

## 2022-05-18 NOTE — Assessment & Plan Note (Addendum)
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 -Differential diagnosis includes marginal zone versus lymphoplasmacytic. OCT 17th, 2023-  Bandlike right retroperitoneal mass anterior to the iliacus muscle is mildly reduced in size from 11/10/2021.  Overall stable.  Discussed the options include rituximab weekly x4 versus radiation-if progressive disease noted on subsequent imaging. Continue monitoring for now.  Imaging ordered for 6 months.  # Prostate cancer-status post cryoablation; PSA 1.15 [SEP 2022]; August 2022 [facility] PET scan-negative for any bone lesions/prostatic lesion.  PSA JULY 2023- 6.4-defer to urology; appt in dec 2023. 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 Kateri Mc - 5000 cGy, 4/13-5/18/2023;  Surgical resection of right thigh mass with by Dr. Cherre Blanc on 01/25/2022. Path = Residual myxofibrosarcoma, grade 2 or 3, negative margins.  Currently under surveillance as per [facility]; q 6 M [mid-dec 2023].  # Paroxymal- A.fib--declined xarelto [FTDDUKG witness;Dr.Gollan]; not on anticoagulation.STABLE.   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 I reviewed/discussed/counseled the patient.   # DISPOSITION: # # follow up in 6 months- MD;  labs- cbc/cmp; iron studies; ferritin; psa; CT AP prior..- Dr.B  # I reviewed the blood work- with the patient in detail; also reviewed the imaging independently [as summarized above]; and with the patient in detail.

## 2022-05-18 NOTE — Progress Notes (Signed)
[place] OFFICE PROGRESS NOTE  Patient Care Team: Idelle Crouch, MD as PCP - General (Internal Medicine) Cammie Sickle, MD as Consulting Physician (Oncology)   Cancer Staging  No matching staging information was found for the patient.   Oncology History Overview Note  Recently moved to the area from [place] He brought records from his prior urologist which were copied, scanned into the chart and reviewed Diagnosed with prostate cancer June 2018 with a PSA of 73.  11/12 cores positive prostate biopsy with Gleason 3+3/3+4  MRI showed a 4.5 cm PI-RADS 5 lesion right prostate with extracapsular extension and involvement of the right seminal vesicle Declined radiation therapy Treated with targeted cryoablation with high-dose bicalutamide and [place] Koller x2 years [SEP 2021] He last saw his urologist in [place] November 2021 and PSA was undetectable at <0.04.  Had been off ADT for 6 months at the time of this visit At recent PCP visit his PSA was detectable at 1.67 on 01/03/2021 IMPRESSION: 1. Abnormal tracer avid soft tissue density along the inferior aspect of the right posterior renal fascia, indeterminate. Suspicious for an atypical location of prostate cancer metastasis. Differential considerations include primary soft tissue neoplasm. Consider tissue sampling. 2. Otherwise, no findings of tracer avid primary or metastatic disease. 3. Inferior right upper lobe tracer avid reticulonodular opacity and consolidation, favoring pneumonia. ---------------------------------   DIAGNOSIS:  A. SOFT TISSUE, RIGHT RETROPERITONEAL; CT-GUIDED CORE NEEDLE BIOPSY:  - CD5-NEGATIVE, CD10-NEGATIVE SMALL B-CELL LYMPHOMA.   Comment:  Core biopsy sections display sheets of relatively monomorphic small  lymphocytes with speckled chromatin and scant cytoplasm.  No significant  abnormal population of large lymphocytes is identified. No epithelioid  cell population is present.  No definite follicular architecture is  appreciated.   Immunohistochemical studies show biopsy cores to be comprised almost  exclusively of CD20+ B cells, with relatively few background CD3+ T  cells. B cells are positive for BCL2, and negative for CD5, CD10, BCL6,  MUM1, and Cyclin-D1. CD43 staining appears to match T cells.   Flow cytometric studies are significant for a CD5-, CD10- clonal B cell  population, predominantly small size by light scatter. For further  results, see scanned report in [facility].   Taken together, the findings are compatible with involvement by a CD5-,  CD10- mature B cell lymphoma. The differential diagnosis is broad, and  may favor marginal zone lymphoma, but also includes lymphoplasmacytic  lymphoma, ZD66 negative follicular lymphoma, or atypical CLL. There is  limited tissue present for ancillary testing. However, excisional biopsy  may be helpful, both for evaluation of overall architecture, as well as  providing sufficient tissue for molecular/cytogenetic testing.  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 Peyton Najjar- March 2023-s/p evaluation at [facility]; MRI Postsurgical change status post excision of lateral mid right thigh soft tissue mass. Within the region of resection, there is a 1.5cm residual subcutaneous mass, suspicious for residual myxofibrosarcoma- currently os/p RT for 5 weeks.  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  Ambulating  independently.  Quillian Quince  Ditommaso 81 y.o.  male pleasant patient above history of prostate cancer; and right pelvic/retroperitoneal mass-low-grade lymphoma; and high sarcoma is here for follow-up/review results of CT scan.   Pts wife had a stroke about a month ago and is now her primary caregiver.  Patient underwent neoadjuvant radiation followed by surgery for his sarcoma of the leg/right thigh summer 2023.  He currently follows up with [facility] every 6 months also. He is able to ambulate independently.   Otherwise denies any new shortness of breath or cough.  No chest pain.  Denies any lumps or bumps.   Review of Systems  Constitutional:  Negative for chills, diaphoresis, fever, malaise/fatigue and weight loss.  HENT:  Negative for nosebleeds and sore throat.   Eyes:  Negative for double vision.  Respiratory:  Negative for cough, hemoptysis, sputum production, shortness of breath and wheezing.   Cardiovascular:  Negative for chest pain, palpitations, orthopnea and leg swelling.  Gastrointestinal:  Negative for abdominal pain, blood in stool, constipation, diarrhea, heartburn, melena, nausea and vomiting.  Genitourinary:  Negative for dysuria, frequency and urgency.  Musculoskeletal:  Positive for back pain and joint pain.  Skin: Negative.  Negative for itching and rash.  Neurological:  Negative for dizziness, tingling, focal weakness, weakness and headaches.  Endo/Heme/Allergies:  Does not bruise/bleed easily.  Psychiatric/Behavioral:  Negative for depression. The patient is not nervous/anxious and does not have insomnia.       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   Heart murmur    History of kidney stones    Hypercholesteremia    Hypertension    Shingles    Tremor     PAST SURGICAL HISTORY :   Past Surgical History:  Procedure Laterality Date   FRACTURE SURGERY     bil  heels-plates and screws   HARDWARE REMOVAL Right    foot   HEEL SPUR SURGERY N/A    sarcoma remvoal leg Right 01/25/2022   [facility]   TONSILLECTOMY      FAMILY HISTORY :   Family History  Problem Relation Age of Onset   Heart attack Mother    Stroke Father     SOCIAL HISTORY:   Social History   Tobacco Use   Smoking status: Never   Smokeless tobacco: Never  Vaping Use   Vaping Use: Never used  Substance Use Topics   Alcohol use: Yes    Comment: occ wine 2-3 times monthly   Drug use: Never    ALLERGIES:  is allergic to penicillins.  MEDICATIONS:  Current Outpatient Medications  Medication Sig Dispense Refill   aspirin EC (ASPIRIN ADULT LOW DOSE) 81 MG tablet      Calcium Carb-Cholecalciferol 500-10 MG-MCG TABS Take by mouth.     primidone (MYSOLINE) 50 MG tablet Take 50 mg by mouth in the morning and at bedtime.     simvastatin (ZOCOR) 20 MG tablet Take 1 tablet (20 mg total) by mouth at bedtime. 90 tablet 3   metoprolol tartrate (LOPRESSOR) 25 MG tablet Take 1 tablet (25 mg total) by mouth daily as needed (before surgery). (Patient not taking: Reported on 05/18/2022) 30 tablet 4   No current facility-administered medications for this visit.    PHYSICAL EXAMINATION: ECOG PERFORMANCE STATUS: 0 - Asymptomatic  BP (!) 144/79   Pulse 65   Temp 97.8 F (  36.6 C)   Resp 18   Wt 173 lb 12.8 oz (78.8 kg)   SpO2 100%   BMI 23.57 kg/m   Filed Weights   05/18/22 1032  Weight: 173 lb 12.8 oz (78.8 kg)    Physical Exam Vitals and nursing note reviewed.  HENT:     Head: Normocephalic and atraumatic.     Mouth/Throat:     Pharynx: Oropharynx is clear.  Eyes:     Extraocular Movements: Extraocular movements intact.     Pupils: Pupils are equal, round, and reactive to light.  Cardiovascular:     Rate and Rhythm: Normal rate and regular rhythm.  Pulmonary:     Comments: Decreased breath sounds bilaterally.  Abdominal:     Palpations: Abdomen is soft.   Musculoskeletal:        General: Normal range of motion.     Cervical back: Normal range of motion.  Skin:    General: Skin is warm.  Neurological:     General: No focal deficit present.     Mental Status: He is alert and oriented to person, place, and time.  Psychiatric:        Behavior: Behavior normal.        Judgment: Judgment normal.      LABORATORY DATA:  I have reviewed the data as listed    Component Value Date/Time   NA 139 05/18/2022 0958   K 4.2 05/18/2022 0958   CL 104 05/18/2022 0958   CO2 27 05/18/2022 0958   GLUCOSE 91 05/18/2022 0958   BUN 22 05/18/2022 0958   CREATININE 0.98 05/18/2022 0958   CALCIUM 9.0 05/18/2022 0958   PROT 7.7 05/18/2022 0958   ALBUMIN 3.9 05/18/2022 0958   AST 18 05/18/2022 0958   ALT 9 05/18/2022 0958   ALKPHOS 69 05/18/2022 0958   BILITOT 0.6 05/18/2022 0958   GFRNONAA >60 05/18/2022 0958    No results found for: "SPEP", "UPEP"  Lab Results  Component Value Date   WBC 4.4 05/18/2022   NEUTROABS 3.1 05/18/2022   HGB 12.8 (L) 05/18/2022   HCT 38.0 (L) 05/18/2022   MCV 91.8 05/18/2022   PLT 184 05/18/2022      Chemistry      Component Value Date/Time   NA 139 05/18/2022 0958   K 4.2 05/18/2022 0958   CL 104 05/18/2022 0958   CO2 27 05/18/2022 0958   BUN 22 05/18/2022 0958   CREATININE 0.98 05/18/2022 0958      Component Value Date/Time   CALCIUM 9.0 05/18/2022 0958   ALKPHOS 69 05/18/2022 0958   AST 18 05/18/2022 0958   ALT 9 05/18/2022 0958   BILITOT 0.6 05/18/2022 0958       RADIOGRAPHIC STUDIES: I have personally reviewed the radiological images as listed and agreed with the findings in the report. No results found.   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 -Differential diagnosis includes marginal zone versus lymphoplasmacytic. OCT 17th, 2023-  Bandlike right retroperitoneal mass anterior to the iliacus  muscle is mildly reduced in size from 11/10/2021.  Overall stable.  Discussed the options include rituximab weekly x4 versus radiation-if progressive disease noted on subsequent imaging. Continue monitoring for now.  Imaging ordered for 6 months.  # Prostate cancer-status post cryoablation; PSA 1.15 [SEP 2022]; August 2022 PSMA PET scan-negative for any bone lesions/prostatic lesion.  PSA JULY 2023- 6.4-defer to urology; appt in dec 2023. 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 Kateri Mc - 5000 cGy, 4/13-5/18/2023;  Surgical resection of right thigh mass with by Dr. Cherre Blanc on 01/25/2022. Path = Residual myxofibrosarcoma, grade 2 or 3, negative margins.  Currently under surveillance as per [facility]; q 6 M [mid-dec 2023].  # Paroxymal- A.fib--declined xarelto [[facility] witness;Dr.Gollan]; not on anticoagulation.STABLE.   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 I reviewed/discussed/counseled the patient.   # DISPOSITION: # # follow up in 6 months- MD;  labs- cbc/cmp; iron studies; ferritin; psa; CT AP prior..- Dr.B  # I reviewed the blood work- with the patient in detail; also reviewed the imaging independently [as summarized above]; and with the patient in detail.       Orders Placed This Encounter  Procedures   CT ABDOMEN PELVIS W CONTRAST    Standing Status:   Future    Standing Expiration Date:   05/19/2023    Order Specific Question:   If indicated for the ordered procedure, I authorize the administration of contrast media per Radiology protocol    Answer:   Yes    Order Specific Question:   Preferred imaging location?    Answer:   [facility]    Order Specific Question:    Radiology Contrast Protocol - do NOT remove file path    Answer:   [email or web address]   CBC with Differential/Platelet    Standing Status:   Future    Standing Expiration Date:   05/18/2023   Comprehensive metabolic panel    Standing Status:   Future    Standing Expiration Date:   05/18/2023   Iron and TIBC([facility])    Standing Status:   Future    Standing Expiration Date:   05/19/2023   Ferritin    Standing Status:   Future    Standing Expiration Date:   05/19/2023   PSA    Standing Status:   Future    Standing Expiration Date:   05/19/2023   All questions were answered. The patient knows to call the clinic with any problems, questions or concerns.      Cammie Sickle, MD 05/18/2022 1:16 PM

## 2022-05-18 NOTE — Progress Notes (Signed)
Pts wife had a stroke about a month ago and is now her primary caregiver.

## 2022-05-20 ENCOUNTER — Encounter: Payer: Self-pay | Admitting: Urology

## 2022-05-23 ENCOUNTER — Other Ambulatory Visit (HOSPITAL_COMMUNITY): Payer: Self-pay | Admitting: Neurology

## 2022-05-23 ENCOUNTER — Other Ambulatory Visit: Payer: Self-pay | Admitting: Neurology

## 2022-05-23 DIAGNOSIS — R251 Tremor, unspecified: Secondary | ICD-10-CM

## 2022-05-26 ENCOUNTER — Other Ambulatory Visit: Payer: Self-pay | Admitting: Urology

## 2022-05-26 DIAGNOSIS — C61 Malignant neoplasm of prostate: Secondary | ICD-10-CM

## 2022-05-29 ENCOUNTER — Ambulatory Visit
Admission: RE | Admit: 2022-05-29 | Discharge: 2022-05-29 | Disposition: A | Discharge status: Home / Self Care | Payer: Medicare HMO | Source: Ambulatory Visit | Attending: Neurology | Admitting: Neurology

## 2022-05-29 DIAGNOSIS — R251 Tremor, unspecified: Secondary | ICD-10-CM | POA: Insufficient documentation

## 2022-06-12 ENCOUNTER — Ambulatory Visit
Admission: RE | Admit: 2022-06-12 | Discharge: 2022-06-12 | Disposition: A | Discharge status: Home / Self Care | Payer: Medicare HMO | Source: Ambulatory Visit | Attending: Urology | Admitting: Urology

## 2022-06-12 DIAGNOSIS — R9721 Rising PSA following treatment for malignant neoplasm of prostate: Secondary | ICD-10-CM | POA: Diagnosis not present

## 2022-06-12 DIAGNOSIS — C61 Malignant neoplasm of prostate: Secondary | ICD-10-CM

## 2022-06-12 DIAGNOSIS — Z8546 Personal history of malignant neoplasm of prostate: Secondary | ICD-10-CM | POA: Diagnosis not present

## 2022-06-12 MED ORDER — PIFLIFOLASTAT F 18 (PYLARIFY) INJECTION
9.0000 | Freq: Once | INTRAVENOUS | Status: AC
  Administered 2022-06-12: 9.89 via INTRAVENOUS

## 2022-06-13 ENCOUNTER — Encounter: Payer: Self-pay | Admitting: Urology

## 2022-06-20 ENCOUNTER — Other Ambulatory Visit: Payer: Self-pay | Admitting: Urology

## 2022-06-20 DIAGNOSIS — C61 Malignant neoplasm of prostate: Secondary | ICD-10-CM

## 2022-06-26 ENCOUNTER — Telehealth: Payer: Self-pay | Admitting: *Deleted

## 2022-06-26 NOTE — Telephone Encounter (Signed)
Notified patient of appointment time and date. Patient given directions to clinic, verbalized understanding.

## 2022-07-06 ENCOUNTER — Other Ambulatory Visit: Payer: Medicare HMO

## 2022-07-10 ENCOUNTER — Ambulatory Visit: Payer: Medicare HMO | Admitting: Urology

## 2022-07-11 ENCOUNTER — Telehealth: Payer: Self-pay | Admitting: Family Medicine

## 2022-07-11 ENCOUNTER — Ambulatory Visit
Admission: RE | Admit: 2022-07-11 | Discharge: 2022-07-11 | Disposition: A | Discharge status: Home / Self Care | Payer: Medicare HMO | Source: Ambulatory Visit | Attending: Radiation Oncology | Admitting: Radiation Oncology

## 2022-07-11 ENCOUNTER — Telehealth: Payer: Self-pay | Admitting: Cardiovascular Disease

## 2022-07-11 ENCOUNTER — Encounter: Payer: Self-pay | Admitting: Radiation Oncology

## 2022-07-11 ENCOUNTER — Other Ambulatory Visit: Payer: Medicare HMO

## 2022-07-11 ENCOUNTER — Encounter: Payer: Self-pay | Admitting: Family Medicine

## 2022-07-11 VITALS — BP 152/74 | HR 68 | Temp 98.0°F | Resp 16 | Ht 72.0 in | Wt 176.1 lb

## 2022-07-11 DIAGNOSIS — Z87442 Personal history of urinary calculi: Secondary | ICD-10-CM | POA: Insufficient documentation

## 2022-07-11 DIAGNOSIS — G40909 Epilepsy, unspecified, not intractable, without status epilepticus: Secondary | ICD-10-CM | POA: Diagnosis not present

## 2022-07-11 DIAGNOSIS — I10 Essential (primary) hypertension: Secondary | ICD-10-CM | POA: Insufficient documentation

## 2022-07-11 DIAGNOSIS — Z8572 Personal history of non-Hodgkin lymphomas: Secondary | ICD-10-CM | POA: Insufficient documentation

## 2022-07-11 DIAGNOSIS — C61 Malignant neoplasm of prostate: Secondary | ICD-10-CM | POA: Diagnosis not present

## 2022-07-11 DIAGNOSIS — E7800 Pure hypercholesterolemia, unspecified: Secondary | ICD-10-CM | POA: Diagnosis not present

## 2022-07-11 NOTE — Telephone Encounter (Signed)
Pt called back to schedule gold seed marker and your schedule has been blocked, pt is eager to start radiation. Please advise where pt can be scheduled. Your vasectomies are schedule for 25mn, is that a possibility? Of maybe at the end of the day?

## 2022-07-11 NOTE — Telephone Encounter (Signed)
Spoke to patient appointment for Gold seed has been made for 12/26. A cardiac clearance has been sent to Dr. Rockey Situ to stop Aspirin 5 days prior. Prep instructions have been sent to [facility].

## 2022-07-11 NOTE — Telephone Encounter (Signed)
LMOM for patient to return call. He will need appointment for Gold seed markers with Dr. Bernardo Heater

## 2022-07-11 NOTE — Telephone Encounter (Signed)
   Pre-operative Risk Assessment    Patient Name: Augusta Hilbert  DOB: 08-May-1941 MRN: [id number]      Request for Surgical Clearance    Procedure:   Gold seed markers for prostate cancer  Date of Surgery:  Clearance 07/25/22                                 Surgeon:  not indicated Surgeon's Group or Practice Name:  [facility] Phone number:  [phone] Fax number:  [phone]   Type of Clearance Requested:   - Pharmacy:  Hold Aspirin 81 MG - stop 5 days prior   Type of Anesthesia:  Not Indicated   Additional requests/questions:    Manfred Arch   07/11/2022, 4:41 PM

## 2022-07-11 NOTE — Consult Note (Signed)
NEW PATIENT EVALUATION  Name: Curtis Quinn  MRN: [id number]  Date:   07/11/2022     DOB: 03/02/1941   This 81 y.o. 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  REFERRING PHYSICIAN: Idelle Crouch, MD  CHIEF COMPLAINT:  Chief Complaint  Patient presents with   Prostate Cancer    DIAGNOSIS: The encounter diagnosis was Malignant neoplasm of prostate ([place]).   PREVIOUS INVESTIGATIONS:  PSMA PET scan reviewed Previous pathology reports reviewed Clinical notes reviewed  HPI: Patient is a 81 year old male with complicated medical history including retroperitoneal small B-cell lymphoma as well as a right thigh myxofibrosarcoma treated with neoadjuvant radiation and surgical resection at [facility].  His cyst his urologic history dates back to June 2018 when he presented with a PSA of 70 10/09/2010 cores were positive with Gleason 7 (3+4) adenocarcinoma he declined radiation therapy and was treated with targeted cryoablation and ADT therapy for 2 years.  His PSA had remained undetectable at less than 0.04 back in 21.  His PSA has continually progressed from 6.44 months ago to now 10.17.  He had a PSMA PET showing a new focal radiotracer activity in the right apex of the prostate consistent with prostate carcinoma local recurrence.  No adenopathy or metastatic disease was seen.  He is seen today for consideration of radiation therapy to his prostate.  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  PLANNED TREATMENT REGIMEN: IMRT radiation therapy to prostate and pelvic nodes  PAST MEDICAL HISTORY:  has a past medical history of Arthritis, Cancer ([place]), Epilepsy ([place]), Heart murmur, History of kidney stones, Hypercholesteremia, Hypertension, Shingles, and  Tremor.    PAST SURGICAL HISTORY:  Past Surgical History:  Procedure Laterality Date   FRACTURE SURGERY     bil heels-plates and screws   HARDWARE REMOVAL Right    foot   HEEL SPUR SURGERY N/A    sarcoma remvoal leg Right 01/25/2022   [facility]   TONSILLECTOMY      FAMILY HISTORY: family history includes Heart attack in his mother; Stroke in his father.  SOCIAL HISTORY:  reports that he has never smoked. He has never used smokeless tobacco. He reports current alcohol use. He reports that he does not use drugs.  ALLERGIES: Penicillins  MEDICATIONS:  Current Outpatient Medications  Medication Sig Dispense Refill   aspirin EC (ASPIRIN ADULT LOW DOSE) 81 MG tablet      Calcium Carb-Cholecalciferol 500-10 MG-MCG TABS Take by mouth.     primidone (MYSOLINE) 50 MG tablet Take 50 mg by mouth in the morning and at bedtime.     simvastatin (ZOCOR) 20 MG tablet Take 1 tablet (20 mg total) by mouth at bedtime. 90 tablet 3   No current facility-administered medications for this encounter.    ECOG PERFORMANCE STATUS:  0 - Asymptomatic  REVIEW OF SYSTEMS: Patient has history of myxofibrosarcoma of the thigh status post radiation and surgical resection.  Also history of small B-cell lymphoma. Patient denies any weight loss, fatigue, weakness, fever, chills or night sweats. Patient denies any loss of vision, blurred vision. Patient denies any ringing  of the ears or hearing loss. No irregular heartbeat. Patient denies heart murmur or history of fainting. Patient denies any chest pain or pain radiating to her upper  extremities. Patient denies any shortness of breath, difficulty breathing at night, cough or hemoptysis. Patient denies any swelling in the lower legs. Patient denies any nausea vomiting, vomiting of blood, or coffee ground material in the vomitus. Patient denies any stomach pain. Patient states has had normal bowel movements no significant constipation or diarrhea. Patient denies any dysuria,  hematuria or significant nocturia. Patient denies any problems walking, swelling in the joints or loss of balance. Patient denies any skin changes, loss of hair or loss of weight. Patient denies any excessive worrying or anxiety or significant depression. Patient denies any problems with insomnia. Patient denies excessive thirst, polyuria, polydipsia. Patient denies any swollen glands, patient denies easy bruising or easy bleeding. Patient denies any recent infections, allergies or URI. Patient "s visual fields have not changed significantly in recent time.   PHYSICAL EXAM: BP (!) 152/74 (BP Location: Left Arm, Patient Position: Sitting, Cuff Size: Normal)   Pulse 68   Temp 98 F (36.7 C) (Tympanic)   Resp 16   Ht 6' (1.829 m) Comment: stated ht  Wt 176 lb 1.6 oz (79.9 kg)   BMI 23.88 kg/m  Well-developed well-nourished patient in NAD. HEENT reveals PERLA, EOMI, discs not visualized.  Oral cavity is clear. No oral mucosal lesions are identified. Neck is clear without evidence of cervical or supraclavicular adenopathy. Lungs are clear to A&P. Cardiac examination is essentially unremarkable with regular rate and rhythm without murmur rub or thrill. Abdomen is benign with no organomegaly or masses noted. Motor sensory and DTR levels are equal and symmetric in the upper and lower extremities. Cranial nerves II through XII are grossly intact. Proprioception is intact. No peripheral adenopathy or edema is identified. No motor or sensory levels are noted. Crude visual fields are within normal range.  LABORATORY DATA: Pathology reports reviewed    RADIOLOGY RESULTS: Brain MRI PSMA PET scan and CT scans reviewed compatible with above-stated findings   IMPRESSION: Stage IIIb based on initial MRI scan showing seminal vesicle involvement Gleason 7 (3+4 adenocarcinoma the prostate presenting with a PSA greater than 28 and 81 year old male  PLAN: At this time I put together his past prostate history as well  as current pathologic findings.  Based on the [facility] he has a greater than 30% chance of lymph node involvement in his pelvis.  I have recommended IMRT radiation therapy to both his prostate and pelvic nodes.  Would treat his prostate 80 Gray over 8 weeks in his pelvic nodes to 40 Gray using IMRT dose painting technique.  At this time based on his previous history of ADT therapy I will leave that to urology for further ADT therapy opinion.  I have asked Dr. Bernardo Heater to place fiducial markers for daily image guided treatment.  Risks and benefits of treatment including increased lower urinary tract symptoms diarrhea fatigue alteration of blood count skin reaction all were reviewed with the patient.  Will schedule simulation when he has had his markers placed.  Patient comprehends my recommendations well.  I would like to take this opportunity to thank you for allowing me to participate in the care of your patient.Noreene Filbert, MD

## 2022-07-12 NOTE — Telephone Encounter (Signed)
   Patient Name: Curtis Quinn  DOB: Dec 09, 1940 MRN: [id number]  Primary Cardiologist: None  Chart reviewed as part of pre-operative protocol coverage. Pharmacy clearance only. Per office protocols [name] may hold Aspirin 5-7 days prior to planned procedure.  I will route this recommendation to the requesting party via Epic fax function and remove from pre-op pool.  Please call with questions.  Loel Dubonnet, NP 07/12/2022, 7:57 AM

## 2022-07-25 ENCOUNTER — Ambulatory Visit: Payer: Medicare HMO | Admitting: Urology

## 2022-07-25 ENCOUNTER — Encounter: Payer: Self-pay | Admitting: Urology

## 2022-07-25 VITALS — BP 129/82 | HR 56 | Ht 72.0 in | Wt 165.0 lb

## 2022-07-25 DIAGNOSIS — C61 Malignant neoplasm of prostate: Secondary | ICD-10-CM

## 2022-07-25 MED ORDER — GENTAMICIN SULFATE 40 MG/ML IJ SOLN
80.0000 mg | Freq: Once | INTRAMUSCULAR | Status: AC
  Administered 2022-07-25: 80 mg via INTRAMUSCULAR

## 2022-07-25 MED ORDER — LEVOFLOXACIN 500 MG PO TABS
500.0000 mg | ORAL_TABLET | Freq: Once | ORAL | Status: AC
  Administered 2022-07-25: 500 mg via ORAL

## 2022-07-25 NOTE — Progress Notes (Addendum)
07/25/22  CC: gold fiducial marker placement  HPI: 81 y.o. male with prostate cancer who presents today for placement of fiducial seed markers in anticipation of his upcoming IMRT with Dr. Rushie Chestnut.  Prostate Gold fiducial Marker Placement Procedure   Informed consent was obtained after discussing risks/benefits of the procedure.  A time out was performed to ensure correct patient identity.  Pre-Procedure: - Gentamicin given prophylactically - PO Levaquin 500 mg also given today  Procedure: - Lidocaine jelly was administered per rectum - Rectal ultrasound probe was placed without difficulty and the prostate visualized - Prostatic block performed with 10 mL 1% Xylocaine - 3 fiducial gold seed markers placed, one at right base, one at left base, one at apex of prostate gland under transrectal ultrasound guidance - Prostate volume was calculated at 29 cc  Post-Procedure: - Patient tolerated the procedure well - He was counseled to seek immediate medical attention if experiences any severe pain, significant bleeding, or fevers    Irineo Axon, MD

## 2022-07-26 ENCOUNTER — Encounter: Payer: Self-pay | Admitting: Urology

## 2022-08-09 ENCOUNTER — Ambulatory Visit: Admission: RE | Admit: 2022-08-09 | Payer: Medicare HMO | Source: Ambulatory Visit

## 2022-08-09 DIAGNOSIS — C61 Malignant neoplasm of prostate: Secondary | ICD-10-CM | POA: Insufficient documentation

## 2022-08-09 DIAGNOSIS — Z510 Encounter for antineoplastic radiation therapy: Secondary | ICD-10-CM | POA: Diagnosis not present

## 2022-08-16 ENCOUNTER — Other Ambulatory Visit: Payer: Self-pay | Admitting: *Deleted

## 2022-08-16 DIAGNOSIS — C61 Malignant neoplasm of prostate: Secondary | ICD-10-CM

## 2022-08-17 DIAGNOSIS — Z510 Encounter for antineoplastic radiation therapy: Secondary | ICD-10-CM | POA: Diagnosis not present

## 2022-08-21 ENCOUNTER — Ambulatory Visit
Admission: RE | Admit: 2022-08-21 | Discharge: 2022-08-21 | Disposition: A | Discharge status: Home / Self Care | Payer: Medicare HMO | Source: Ambulatory Visit | Attending: Radiation Oncology | Admitting: Radiation Oncology

## 2022-08-22 ENCOUNTER — Other Ambulatory Visit: Payer: Self-pay

## 2022-08-22 ENCOUNTER — Ambulatory Visit
Admission: RE | Admit: 2022-08-22 | Discharge: 2022-08-22 | Disposition: A | Discharge status: Home / Self Care | Payer: Medicare HMO | Source: Ambulatory Visit | Attending: Radiation Oncology | Admitting: Radiation Oncology

## 2022-08-22 DIAGNOSIS — Z510 Encounter for antineoplastic radiation therapy: Secondary | ICD-10-CM | POA: Diagnosis not present

## 2022-08-22 LAB — RAD ONC ARIA SESSION SUMMARY
Course Elapsed Days: 0
Plan Fractions Treated to Date: 1
Plan Prescribed Dose Per Fraction: 2 Gy
Plan Total Fractions Prescribed: 40
Plan Total Prescribed Dose: 80 Gy
Reference Point Dosage Given to Date: 2 Gy
Reference Point Session Dosage Given: 2 Gy
Session Number: 1

## 2022-08-23 ENCOUNTER — Other Ambulatory Visit: Payer: Self-pay

## 2022-08-23 ENCOUNTER — Ambulatory Visit
Admission: RE | Admit: 2022-08-23 | Discharge: 2022-08-23 | Disposition: A | Discharge status: Home / Self Care | Payer: Medicare HMO | Source: Ambulatory Visit | Attending: Radiation Oncology | Admitting: Radiation Oncology

## 2022-08-23 DIAGNOSIS — Z510 Encounter for antineoplastic radiation therapy: Secondary | ICD-10-CM | POA: Diagnosis not present

## 2022-08-23 LAB — RAD ONC ARIA SESSION SUMMARY
Course Elapsed Days: 1
Plan Fractions Treated to Date: 2
Plan Prescribed Dose Per Fraction: 2 Gy
Plan Total Fractions Prescribed: 40
Plan Total Prescribed Dose: 80 Gy
Reference Point Dosage Given to Date: 4 Gy
Reference Point Session Dosage Given: 2 Gy
Session Number: 2

## 2022-08-24 ENCOUNTER — Other Ambulatory Visit: Payer: Self-pay

## 2022-08-24 ENCOUNTER — Ambulatory Visit
Admission: RE | Admit: 2022-08-24 | Discharge: 2022-08-24 | Disposition: A | Discharge status: Home / Self Care | Payer: Medicare HMO | Source: Ambulatory Visit | Attending: Radiation Oncology | Admitting: Radiation Oncology

## 2022-08-24 DIAGNOSIS — Z510 Encounter for antineoplastic radiation therapy: Secondary | ICD-10-CM | POA: Diagnosis not present

## 2022-08-24 LAB — RAD ONC ARIA SESSION SUMMARY
Course Elapsed Days: 2
Plan Fractions Treated to Date: 3
Plan Prescribed Dose Per Fraction: 2 Gy
Plan Total Fractions Prescribed: 40
Plan Total Prescribed Dose: 80 Gy
Reference Point Dosage Given to Date: 6 Gy
Reference Point Session Dosage Given: 2 Gy
Session Number: 3

## 2022-08-25 ENCOUNTER — Ambulatory Visit
Admission: RE | Admit: 2022-08-25 | Discharge: 2022-08-25 | Disposition: A | Discharge status: Home / Self Care | Payer: Medicare HMO | Source: Ambulatory Visit | Attending: Radiation Oncology | Admitting: Radiation Oncology

## 2022-08-25 ENCOUNTER — Other Ambulatory Visit: Payer: Self-pay

## 2022-08-25 DIAGNOSIS — Z510 Encounter for antineoplastic radiation therapy: Secondary | ICD-10-CM | POA: Diagnosis not present

## 2022-08-25 LAB — RAD ONC ARIA SESSION SUMMARY
Course Elapsed Days: 3
Plan Fractions Treated to Date: 4
Plan Prescribed Dose Per Fraction: 2 Gy
Plan Total Fractions Prescribed: 40
Plan Total Prescribed Dose: 80 Gy
Reference Point Dosage Given to Date: 8 Gy
Reference Point Session Dosage Given: 2 Gy
Session Number: 4

## 2022-08-28 ENCOUNTER — Other Ambulatory Visit: Payer: Self-pay

## 2022-08-28 ENCOUNTER — Ambulatory Visit
Admission: RE | Admit: 2022-08-28 | Discharge: 2022-08-28 | Disposition: A | Discharge status: Home / Self Care | Payer: Medicare HMO | Source: Ambulatory Visit | Attending: Radiation Oncology | Admitting: Radiation Oncology

## 2022-08-28 DIAGNOSIS — Z510 Encounter for antineoplastic radiation therapy: Secondary | ICD-10-CM | POA: Diagnosis not present

## 2022-08-28 LAB — RAD ONC ARIA SESSION SUMMARY
Course Elapsed Days: 6
Plan Fractions Treated to Date: 1
Plan Prescribed Dose Per Fraction: 2 Gy
Plan Total Fractions Prescribed: 36
Plan Total Prescribed Dose: 72 Gy
Reference Point Dosage Given to Date: 10 Gy
Reference Point Session Dosage Given: 2 Gy
Session Number: 5

## 2022-08-29 ENCOUNTER — Other Ambulatory Visit: Payer: Self-pay

## 2022-08-29 ENCOUNTER — Ambulatory Visit
Admission: RE | Admit: 2022-08-29 | Discharge: 2022-08-29 | Disposition: A | Discharge status: Home / Self Care | Payer: Medicare HMO | Source: Ambulatory Visit | Attending: Radiation Oncology | Admitting: Radiation Oncology

## 2022-08-29 DIAGNOSIS — Z510 Encounter for antineoplastic radiation therapy: Secondary | ICD-10-CM | POA: Diagnosis not present

## 2022-08-29 LAB — RAD ONC ARIA SESSION SUMMARY
Course Elapsed Days: 7
Plan Fractions Treated to Date: 2
Plan Prescribed Dose Per Fraction: 2 Gy
Plan Total Fractions Prescribed: 36
Plan Total Prescribed Dose: 72 Gy
Reference Point Dosage Given to Date: 12 Gy
Reference Point Session Dosage Given: 2 Gy
Session Number: 6

## 2022-08-30 ENCOUNTER — Inpatient Hospital Stay: Payer: Medicare HMO | Attending: Internal Medicine

## 2022-08-30 ENCOUNTER — Ambulatory Visit
Admission: RE | Admit: 2022-08-30 | Discharge: 2022-08-30 | Disposition: A | Discharge status: Home / Self Care | Payer: Medicare HMO | Source: Ambulatory Visit | Attending: Radiation Oncology | Admitting: Radiation Oncology

## 2022-08-30 ENCOUNTER — Other Ambulatory Visit: Payer: Self-pay

## 2022-08-30 DIAGNOSIS — C61 Malignant neoplasm of prostate: Secondary | ICD-10-CM | POA: Diagnosis present

## 2022-08-30 DIAGNOSIS — Z510 Encounter for antineoplastic radiation therapy: Secondary | ICD-10-CM | POA: Diagnosis not present

## 2022-08-30 LAB — RAD ONC ARIA SESSION SUMMARY
Course Elapsed Days: 8
Plan Fractions Treated to Date: 3
Plan Prescribed Dose Per Fraction: 2 Gy
Plan Total Fractions Prescribed: 36
Plan Total Prescribed Dose: 72 Gy
Reference Point Dosage Given to Date: 14 Gy
Reference Point Session Dosage Given: 2 Gy
Session Number: 7

## 2022-08-30 LAB — CBC
HCT: 34.2 % — ABNORMAL LOW (ref 39.0–52.0)
Hemoglobin: 11.7 g/dL — ABNORMAL LOW (ref 13.0–17.0)
MCH: 30.8 pg (ref 26.0–34.0)
MCHC: 34.2 g/dL (ref 30.0–36.0)
MCV: 90 fL (ref 80.0–100.0)
Platelets: 167 10*3/uL (ref 150–400)
RBC: 3.8 MIL/uL — ABNORMAL LOW (ref 4.22–5.81)
RDW: 12.6 % (ref 11.5–15.5)
WBC: 3.4 10*3/uL — ABNORMAL LOW (ref 4.0–10.5)
nRBC: 0 % (ref 0.0–0.2)

## 2022-08-31 ENCOUNTER — Ambulatory Visit
Admission: RE | Admit: 2022-08-31 | Discharge: 2022-08-31 | Disposition: A | Discharge status: Home / Self Care | Payer: Medicare HMO | Source: Ambulatory Visit | Attending: Radiation Oncology | Admitting: Radiation Oncology

## 2022-08-31 ENCOUNTER — Other Ambulatory Visit: Payer: Self-pay

## 2022-08-31 DIAGNOSIS — C61 Malignant neoplasm of prostate: Secondary | ICD-10-CM | POA: Insufficient documentation

## 2022-08-31 DIAGNOSIS — Z510 Encounter for antineoplastic radiation therapy: Secondary | ICD-10-CM | POA: Diagnosis present

## 2022-08-31 LAB — RAD ONC ARIA SESSION SUMMARY
Course Elapsed Days: 9
Plan Fractions Treated to Date: 4
Plan Prescribed Dose Per Fraction: 2 Gy
Plan Total Fractions Prescribed: 36
Plan Total Prescribed Dose: 72 Gy
Reference Point Dosage Given to Date: 16 Gy
Reference Point Session Dosage Given: 2 Gy
Session Number: 8

## 2022-09-01 ENCOUNTER — Ambulatory Visit
Admission: RE | Admit: 2022-09-01 | Discharge: 2022-09-01 | Disposition: A | Discharge status: Home / Self Care | Payer: Medicare HMO | Source: Ambulatory Visit | Attending: Radiation Oncology | Admitting: Radiation Oncology

## 2022-09-01 ENCOUNTER — Other Ambulatory Visit: Payer: Self-pay

## 2022-09-01 DIAGNOSIS — Z510 Encounter for antineoplastic radiation therapy: Secondary | ICD-10-CM | POA: Diagnosis not present

## 2022-09-01 LAB — RAD ONC ARIA SESSION SUMMARY
Course Elapsed Days: 10
Plan Fractions Treated to Date: 5
Plan Prescribed Dose Per Fraction: 2 Gy
Plan Total Fractions Prescribed: 36
Plan Total Prescribed Dose: 72 Gy
Reference Point Dosage Given to Date: 18 Gy
Reference Point Session Dosage Given: 2 Gy
Session Number: 9

## 2022-09-04 ENCOUNTER — Ambulatory Visit
Admission: RE | Admit: 2022-09-04 | Discharge: 2022-09-04 | Disposition: A | Discharge status: Home / Self Care | Payer: Medicare HMO | Source: Ambulatory Visit | Attending: Radiation Oncology | Admitting: Radiation Oncology

## 2022-09-04 ENCOUNTER — Other Ambulatory Visit: Payer: Self-pay

## 2022-09-04 DIAGNOSIS — Z510 Encounter for antineoplastic radiation therapy: Secondary | ICD-10-CM | POA: Diagnosis not present

## 2022-09-04 LAB — RAD ONC ARIA SESSION SUMMARY
Course Elapsed Days: 13
Plan Fractions Treated to Date: 6
Plan Prescribed Dose Per Fraction: 2 Gy
Plan Total Fractions Prescribed: 36
Plan Total Prescribed Dose: 72 Gy
Reference Point Dosage Given to Date: 20 Gy
Reference Point Session Dosage Given: 2 Gy
Session Number: 10

## 2022-09-05 ENCOUNTER — Other Ambulatory Visit: Payer: Self-pay

## 2022-09-05 ENCOUNTER — Ambulatory Visit
Admission: RE | Admit: 2022-09-05 | Discharge: 2022-09-05 | Disposition: A | Discharge status: Home / Self Care | Payer: Medicare HMO | Source: Ambulatory Visit | Attending: Radiation Oncology | Admitting: Radiation Oncology

## 2022-09-05 DIAGNOSIS — Z510 Encounter for antineoplastic radiation therapy: Secondary | ICD-10-CM | POA: Diagnosis not present

## 2022-09-05 LAB — RAD ONC ARIA SESSION SUMMARY
Course Elapsed Days: 14
Plan Fractions Treated to Date: 7
Plan Prescribed Dose Per Fraction: 2 Gy
Plan Total Fractions Prescribed: 36
Plan Total Prescribed Dose: 72 Gy
Reference Point Dosage Given to Date: 22 Gy
Reference Point Session Dosage Given: 2 Gy
Session Number: 11

## 2022-09-06 ENCOUNTER — Ambulatory Visit
Admission: RE | Admit: 2022-09-06 | Discharge: 2022-09-06 | Disposition: A | Discharge status: Home / Self Care | Payer: Medicare HMO | Source: Ambulatory Visit | Attending: Radiation Oncology | Admitting: Radiation Oncology

## 2022-09-06 ENCOUNTER — Other Ambulatory Visit: Payer: Self-pay

## 2022-09-06 DIAGNOSIS — Z510 Encounter for antineoplastic radiation therapy: Secondary | ICD-10-CM | POA: Diagnosis not present

## 2022-09-06 LAB — RAD ONC ARIA SESSION SUMMARY
Course Elapsed Days: 15
Plan Fractions Treated to Date: 8
Plan Prescribed Dose Per Fraction: 2 Gy
Plan Total Fractions Prescribed: 36
Plan Total Prescribed Dose: 72 Gy
Reference Point Dosage Given to Date: 24 Gy
Reference Point Session Dosage Given: 2 Gy
Session Number: 12

## 2022-09-07 ENCOUNTER — Inpatient Hospital Stay: Payer: Medicare HMO

## 2022-09-07 ENCOUNTER — Other Ambulatory Visit: Payer: Self-pay

## 2022-09-07 ENCOUNTER — Ambulatory Visit
Admission: RE | Admit: 2022-09-07 | Discharge: 2022-09-07 | Disposition: A | Discharge status: Home / Self Care | Payer: Medicare HMO | Source: Ambulatory Visit | Attending: Radiation Oncology | Admitting: Radiation Oncology

## 2022-09-07 DIAGNOSIS — Z510 Encounter for antineoplastic radiation therapy: Secondary | ICD-10-CM | POA: Diagnosis not present

## 2022-09-07 LAB — RAD ONC ARIA SESSION SUMMARY
Course Elapsed Days: 16
Plan Fractions Treated to Date: 9
Plan Prescribed Dose Per Fraction: 2 Gy
Plan Total Fractions Prescribed: 36
Plan Total Prescribed Dose: 72 Gy
Reference Point Dosage Given to Date: 26 Gy
Reference Point Session Dosage Given: 2 Gy
Session Number: 13

## 2022-09-08 ENCOUNTER — Ambulatory Visit
Admission: RE | Admit: 2022-09-08 | Discharge: 2022-09-08 | Disposition: A | Discharge status: Home / Self Care | Payer: Medicare HMO | Source: Ambulatory Visit | Attending: Radiation Oncology | Admitting: Radiation Oncology

## 2022-09-08 ENCOUNTER — Other Ambulatory Visit: Payer: Self-pay

## 2022-09-08 DIAGNOSIS — Z510 Encounter for antineoplastic radiation therapy: Secondary | ICD-10-CM | POA: Diagnosis not present

## 2022-09-08 LAB — RAD ONC ARIA SESSION SUMMARY
Course Elapsed Days: 17
Plan Fractions Treated to Date: 10
Plan Prescribed Dose Per Fraction: 2 Gy
Plan Total Fractions Prescribed: 36
Plan Total Prescribed Dose: 72 Gy
Reference Point Dosage Given to Date: 28 Gy
Reference Point Session Dosage Given: 2 Gy
Session Number: 14

## 2022-09-11 ENCOUNTER — Ambulatory Visit
Admission: RE | Admit: 2022-09-11 | Discharge: 2022-09-11 | Disposition: A | Discharge status: Home / Self Care | Payer: Medicare HMO | Source: Ambulatory Visit | Attending: Radiation Oncology | Admitting: Radiation Oncology

## 2022-09-11 ENCOUNTER — Other Ambulatory Visit: Payer: Self-pay

## 2022-09-11 DIAGNOSIS — Z510 Encounter for antineoplastic radiation therapy: Secondary | ICD-10-CM | POA: Diagnosis not present

## 2022-09-11 LAB — RAD ONC ARIA SESSION SUMMARY
Course Elapsed Days: 20
Plan Fractions Treated to Date: 11
Plan Prescribed Dose Per Fraction: 2 Gy
Plan Total Fractions Prescribed: 36
Plan Total Prescribed Dose: 72 Gy
Reference Point Dosage Given to Date: 30 Gy
Reference Point Session Dosage Given: 2 Gy
Session Number: 15

## 2022-09-12 ENCOUNTER — Other Ambulatory Visit: Payer: Self-pay

## 2022-09-12 ENCOUNTER — Ambulatory Visit
Admission: RE | Admit: 2022-09-12 | Discharge: 2022-09-12 | Disposition: A | Discharge status: Home / Self Care | Payer: Medicare HMO | Source: Ambulatory Visit | Attending: Radiation Oncology | Admitting: Radiation Oncology

## 2022-09-12 DIAGNOSIS — Z510 Encounter for antineoplastic radiation therapy: Secondary | ICD-10-CM | POA: Diagnosis not present

## 2022-09-12 LAB — RAD ONC ARIA SESSION SUMMARY
Course Elapsed Days: 21
Plan Fractions Treated to Date: 12
Plan Prescribed Dose Per Fraction: 2 Gy
Plan Total Fractions Prescribed: 36
Plan Total Prescribed Dose: 72 Gy
Reference Point Dosage Given to Date: 32 Gy
Reference Point Session Dosage Given: 2 Gy
Session Number: 16

## 2022-09-13 ENCOUNTER — Ambulatory Visit
Admission: RE | Admit: 2022-09-13 | Discharge: 2022-09-13 | Disposition: A | Discharge status: Home / Self Care | Payer: Medicare HMO | Source: Ambulatory Visit | Attending: Radiation Oncology | Admitting: Radiation Oncology

## 2022-09-13 ENCOUNTER — Other Ambulatory Visit: Payer: Self-pay

## 2022-09-13 ENCOUNTER — Inpatient Hospital Stay: Payer: Medicare HMO | Attending: Internal Medicine

## 2022-09-13 DIAGNOSIS — C61 Malignant neoplasm of prostate: Secondary | ICD-10-CM | POA: Insufficient documentation

## 2022-09-13 DIAGNOSIS — C8513 Unspecified B-cell lymphoma, intra-abdominal lymph nodes: Secondary | ICD-10-CM | POA: Diagnosis not present

## 2022-09-13 DIAGNOSIS — Z510 Encounter for antineoplastic radiation therapy: Secondary | ICD-10-CM | POA: Diagnosis not present

## 2022-09-13 LAB — CBC
HCT: 33.5 % — ABNORMAL LOW (ref 39.0–52.0)
Hemoglobin: 11.5 g/dL — ABNORMAL LOW (ref 13.0–17.0)
MCH: 30.8 pg (ref 26.0–34.0)
MCHC: 34.3 g/dL (ref 30.0–36.0)
MCV: 89.8 fL (ref 80.0–100.0)
Platelets: 152 10*3/uL (ref 150–400)
RBC: 3.73 MIL/uL — ABNORMAL LOW (ref 4.22–5.81)
RDW: 12.9 % (ref 11.5–15.5)
WBC: 3.5 10*3/uL — ABNORMAL LOW (ref 4.0–10.5)
nRBC: 0 % (ref 0.0–0.2)

## 2022-09-13 LAB — RAD ONC ARIA SESSION SUMMARY
Course Elapsed Days: 22
Plan Fractions Treated to Date: 13
Plan Prescribed Dose Per Fraction: 2 Gy
Plan Total Fractions Prescribed: 36
Plan Total Prescribed Dose: 72 Gy
Reference Point Dosage Given to Date: 34 Gy
Reference Point Session Dosage Given: 2 Gy
Session Number: 17

## 2022-09-14 ENCOUNTER — Ambulatory Visit
Admission: RE | Admit: 2022-09-14 | Discharge: 2022-09-14 | Disposition: A | Discharge status: Home / Self Care | Payer: Medicare HMO | Source: Ambulatory Visit | Attending: Radiation Oncology | Admitting: Radiation Oncology

## 2022-09-14 ENCOUNTER — Other Ambulatory Visit: Payer: Self-pay

## 2022-09-14 DIAGNOSIS — Z510 Encounter for antineoplastic radiation therapy: Secondary | ICD-10-CM | POA: Diagnosis not present

## 2022-09-14 LAB — RAD ONC ARIA SESSION SUMMARY
Course Elapsed Days: 23
Plan Fractions Treated to Date: 14
Plan Prescribed Dose Per Fraction: 2 Gy
Plan Total Fractions Prescribed: 36
Plan Total Prescribed Dose: 72 Gy
Reference Point Dosage Given to Date: 36 Gy
Reference Point Session Dosage Given: 2 Gy
Session Number: 18

## 2022-09-15 ENCOUNTER — Other Ambulatory Visit: Payer: Self-pay

## 2022-09-15 ENCOUNTER — Ambulatory Visit
Admission: RE | Admit: 2022-09-15 | Discharge: 2022-09-15 | Disposition: A | Discharge status: Home / Self Care | Payer: Medicare HMO | Source: Ambulatory Visit | Attending: Radiation Oncology | Admitting: Radiation Oncology

## 2022-09-15 DIAGNOSIS — Z510 Encounter for antineoplastic radiation therapy: Secondary | ICD-10-CM | POA: Diagnosis not present

## 2022-09-15 LAB — RAD ONC ARIA SESSION SUMMARY
Course Elapsed Days: 24
Plan Fractions Treated to Date: 15
Plan Prescribed Dose Per Fraction: 2 Gy
Plan Total Fractions Prescribed: 36
Plan Total Prescribed Dose: 72 Gy
Reference Point Dosage Given to Date: 38 Gy
Reference Point Session Dosage Given: 2 Gy
Session Number: 19

## 2022-09-18 ENCOUNTER — Ambulatory Visit
Admission: RE | Admit: 2022-09-18 | Discharge: 2022-09-18 | Disposition: A | Discharge status: Home / Self Care | Payer: Medicare HMO | Source: Ambulatory Visit | Attending: Radiation Oncology | Admitting: Radiation Oncology

## 2022-09-18 ENCOUNTER — Other Ambulatory Visit: Payer: Self-pay

## 2022-09-18 DIAGNOSIS — Z510 Encounter for antineoplastic radiation therapy: Secondary | ICD-10-CM | POA: Diagnosis not present

## 2022-09-18 LAB — RAD ONC ARIA SESSION SUMMARY
Course Elapsed Days: 27
Plan Fractions Treated to Date: 16
Plan Prescribed Dose Per Fraction: 2 Gy
Plan Total Fractions Prescribed: 36
Plan Total Prescribed Dose: 72 Gy
Reference Point Dosage Given to Date: 40 Gy
Reference Point Session Dosage Given: 2 Gy
Session Number: 20

## 2022-09-19 ENCOUNTER — Ambulatory Visit
Admission: RE | Admit: 2022-09-19 | Discharge: 2022-09-19 | Disposition: A | Discharge status: Home / Self Care | Payer: Medicare HMO | Source: Ambulatory Visit | Attending: Radiation Oncology | Admitting: Radiation Oncology

## 2022-09-19 ENCOUNTER — Other Ambulatory Visit: Payer: Self-pay

## 2022-09-19 DIAGNOSIS — Z510 Encounter for antineoplastic radiation therapy: Secondary | ICD-10-CM | POA: Diagnosis not present

## 2022-09-19 LAB — RAD ONC ARIA SESSION SUMMARY
Course Elapsed Days: 28
Plan Fractions Treated to Date: 17
Plan Prescribed Dose Per Fraction: 2 Gy
Plan Total Fractions Prescribed: 36
Plan Total Prescribed Dose: 72 Gy
Reference Point Dosage Given to Date: 42 Gy
Reference Point Session Dosage Given: 2 Gy
Session Number: 21

## 2022-09-20 ENCOUNTER — Other Ambulatory Visit: Payer: Self-pay

## 2022-09-20 ENCOUNTER — Ambulatory Visit
Admission: RE | Admit: 2022-09-20 | Discharge: 2022-09-20 | Disposition: A | Discharge status: Home / Self Care | Payer: Medicare HMO | Source: Ambulatory Visit | Attending: Radiation Oncology | Admitting: Radiation Oncology

## 2022-09-20 DIAGNOSIS — Z510 Encounter for antineoplastic radiation therapy: Secondary | ICD-10-CM | POA: Diagnosis not present

## 2022-09-20 LAB — RAD ONC ARIA SESSION SUMMARY
Course Elapsed Days: 29
Plan Fractions Treated to Date: 18
Plan Prescribed Dose Per Fraction: 2 Gy
Plan Total Fractions Prescribed: 36
Plan Total Prescribed Dose: 72 Gy
Reference Point Dosage Given to Date: 44 Gy
Reference Point Session Dosage Given: 2 Gy
Session Number: 22

## 2022-09-21 ENCOUNTER — Ambulatory Visit
Admission: RE | Admit: 2022-09-21 | Discharge: 2022-09-21 | Disposition: A | Discharge status: Home / Self Care | Payer: Medicare HMO | Source: Ambulatory Visit | Attending: Radiation Oncology | Admitting: Radiation Oncology

## 2022-09-21 ENCOUNTER — Other Ambulatory Visit: Payer: Self-pay

## 2022-09-21 ENCOUNTER — Inpatient Hospital Stay: Payer: Medicare HMO

## 2022-09-21 DIAGNOSIS — Z510 Encounter for antineoplastic radiation therapy: Secondary | ICD-10-CM | POA: Diagnosis not present

## 2022-09-21 LAB — RAD ONC ARIA SESSION SUMMARY
Course Elapsed Days: 30
Plan Fractions Treated to Date: 19
Plan Prescribed Dose Per Fraction: 2 Gy
Plan Total Fractions Prescribed: 36
Plan Total Prescribed Dose: 72 Gy
Reference Point Dosage Given to Date: 46 Gy
Reference Point Session Dosage Given: 2 Gy
Session Number: 23

## 2022-09-22 ENCOUNTER — Other Ambulatory Visit: Payer: Self-pay

## 2022-09-22 ENCOUNTER — Ambulatory Visit
Admission: RE | Admit: 2022-09-22 | Discharge: 2022-09-22 | Disposition: A | Discharge status: Home / Self Care | Payer: Medicare HMO | Source: Ambulatory Visit | Attending: Radiation Oncology | Admitting: Radiation Oncology

## 2022-09-22 DIAGNOSIS — Z510 Encounter for antineoplastic radiation therapy: Secondary | ICD-10-CM | POA: Diagnosis not present

## 2022-09-22 LAB — RAD ONC ARIA SESSION SUMMARY
Course Elapsed Days: 31
Plan Fractions Treated to Date: 20
Plan Prescribed Dose Per Fraction: 2 Gy
Plan Total Fractions Prescribed: 36
Plan Total Prescribed Dose: 72 Gy
Reference Point Dosage Given to Date: 48 Gy
Reference Point Session Dosage Given: 2 Gy
Session Number: 24

## 2022-09-25 ENCOUNTER — Ambulatory Visit
Admission: RE | Admit: 2022-09-25 | Discharge: 2022-09-25 | Disposition: A | Discharge status: Home / Self Care | Payer: Medicare HMO | Source: Ambulatory Visit | Attending: Radiation Oncology | Admitting: Radiation Oncology

## 2022-09-25 ENCOUNTER — Other Ambulatory Visit: Payer: Self-pay

## 2022-09-25 DIAGNOSIS — Z510 Encounter for antineoplastic radiation therapy: Secondary | ICD-10-CM | POA: Diagnosis not present

## 2022-09-25 LAB — RAD ONC ARIA SESSION SUMMARY
Course Elapsed Days: 34
Plan Fractions Treated to Date: 21
Plan Prescribed Dose Per Fraction: 2 Gy
Plan Total Fractions Prescribed: 36
Plan Total Prescribed Dose: 72 Gy
Reference Point Dosage Given to Date: 50 Gy
Reference Point Session Dosage Given: 2 Gy
Session Number: 25

## 2022-09-26 ENCOUNTER — Ambulatory Visit
Admission: RE | Admit: 2022-09-26 | Discharge: 2022-09-26 | Disposition: A | Discharge status: Home / Self Care | Payer: Medicare HMO | Source: Ambulatory Visit | Attending: Radiation Oncology | Admitting: Radiation Oncology

## 2022-09-26 ENCOUNTER — Other Ambulatory Visit: Payer: Self-pay

## 2022-09-26 DIAGNOSIS — Z510 Encounter for antineoplastic radiation therapy: Secondary | ICD-10-CM | POA: Diagnosis not present

## 2022-09-26 LAB — RAD ONC ARIA SESSION SUMMARY
Course Elapsed Days: 35
Plan Fractions Treated to Date: 22
Plan Prescribed Dose Per Fraction: 2 Gy
Plan Total Fractions Prescribed: 36
Plan Total Prescribed Dose: 72 Gy
Reference Point Dosage Given to Date: 52 Gy
Reference Point Session Dosage Given: 2 Gy
Session Number: 26

## 2022-09-27 ENCOUNTER — Other Ambulatory Visit: Payer: Self-pay

## 2022-09-27 ENCOUNTER — Inpatient Hospital Stay: Payer: Medicare HMO

## 2022-09-27 ENCOUNTER — Ambulatory Visit
Admission: RE | Admit: 2022-09-27 | Discharge: 2022-09-27 | Disposition: A | Discharge status: Home / Self Care | Payer: Medicare HMO | Source: Ambulatory Visit | Attending: Radiation Oncology | Admitting: Radiation Oncology

## 2022-09-27 DIAGNOSIS — Z510 Encounter for antineoplastic radiation therapy: Secondary | ICD-10-CM | POA: Diagnosis not present

## 2022-09-27 DIAGNOSIS — C61 Malignant neoplasm of prostate: Secondary | ICD-10-CM

## 2022-09-27 LAB — RAD ONC ARIA SESSION SUMMARY
Course Elapsed Days: 36
Plan Fractions Treated to Date: 23
Plan Prescribed Dose Per Fraction: 2 Gy
Plan Total Fractions Prescribed: 36
Plan Total Prescribed Dose: 72 Gy
Reference Point Dosage Given to Date: 54 Gy
Reference Point Session Dosage Given: 2 Gy
Session Number: 27

## 2022-09-27 LAB — CBC
HCT: 34.2 % — ABNORMAL LOW (ref 39.0–52.0)
Hemoglobin: 11.7 g/dL — ABNORMAL LOW (ref 13.0–17.0)
MCH: 30.7 pg (ref 26.0–34.0)
MCHC: 34.2 g/dL (ref 30.0–36.0)
MCV: 89.8 fL (ref 80.0–100.0)
Platelets: 157 10*3/uL (ref 150–400)
RBC: 3.81 MIL/uL — ABNORMAL LOW (ref 4.22–5.81)
RDW: 13 % (ref 11.5–15.5)
WBC: 3.8 10*3/uL — ABNORMAL LOW (ref 4.0–10.5)
nRBC: 0 % (ref 0.0–0.2)

## 2022-09-28 ENCOUNTER — Other Ambulatory Visit: Payer: Self-pay

## 2022-09-28 ENCOUNTER — Ambulatory Visit
Admission: RE | Admit: 2022-09-28 | Discharge: 2022-09-28 | Disposition: A | Discharge status: Home / Self Care | Payer: Medicare HMO | Source: Ambulatory Visit | Attending: Radiation Oncology | Admitting: Radiation Oncology

## 2022-09-28 DIAGNOSIS — Z510 Encounter for antineoplastic radiation therapy: Secondary | ICD-10-CM | POA: Diagnosis not present

## 2022-09-28 LAB — RAD ONC ARIA SESSION SUMMARY
Course Elapsed Days: 37
Plan Fractions Treated to Date: 24
Plan Prescribed Dose Per Fraction: 2 Gy
Plan Total Fractions Prescribed: 36
Plan Total Prescribed Dose: 72 Gy
Reference Point Dosage Given to Date: 56 Gy
Reference Point Session Dosage Given: 2 Gy
Session Number: 28

## 2022-09-29 ENCOUNTER — Other Ambulatory Visit: Payer: Self-pay

## 2022-09-29 ENCOUNTER — Ambulatory Visit
Admission: RE | Admit: 2022-09-29 | Discharge: 2022-09-29 | Disposition: A | Discharge status: Home / Self Care | Payer: Medicare HMO | Source: Ambulatory Visit | Attending: Radiation Oncology | Admitting: Radiation Oncology

## 2022-09-29 DIAGNOSIS — Z510 Encounter for antineoplastic radiation therapy: Secondary | ICD-10-CM | POA: Insufficient documentation

## 2022-09-29 DIAGNOSIS — C61 Malignant neoplasm of prostate: Secondary | ICD-10-CM | POA: Insufficient documentation

## 2022-09-29 LAB — RAD ONC ARIA SESSION SUMMARY
Course Elapsed Days: 38
Plan Fractions Treated to Date: 25
Plan Prescribed Dose Per Fraction: 2 Gy
Plan Total Fractions Prescribed: 36
Plan Total Prescribed Dose: 72 Gy
Reference Point Dosage Given to Date: 58 Gy
Reference Point Session Dosage Given: 2 Gy
Session Number: 29

## 2022-10-02 ENCOUNTER — Other Ambulatory Visit: Payer: Self-pay

## 2022-10-02 ENCOUNTER — Ambulatory Visit
Admission: RE | Admit: 2022-10-02 | Discharge: 2022-10-02 | Disposition: A | Discharge status: Home / Self Care | Payer: Medicare HMO | Source: Ambulatory Visit | Attending: Radiation Oncology | Admitting: Radiation Oncology

## 2022-10-02 DIAGNOSIS — Z510 Encounter for antineoplastic radiation therapy: Secondary | ICD-10-CM | POA: Diagnosis not present

## 2022-10-02 LAB — RAD ONC ARIA SESSION SUMMARY
Course Elapsed Days: 41
Plan Fractions Treated to Date: 26
Plan Prescribed Dose Per Fraction: 2 Gy
Plan Total Fractions Prescribed: 36
Plan Total Prescribed Dose: 72 Gy
Reference Point Dosage Given to Date: 60 Gy
Reference Point Session Dosage Given: 2 Gy
Session Number: 30

## 2022-10-03 ENCOUNTER — Ambulatory Visit
Admission: RE | Admit: 2022-10-03 | Discharge: 2022-10-03 | Disposition: A | Discharge status: Home / Self Care | Payer: Medicare HMO | Source: Ambulatory Visit | Attending: Radiation Oncology | Admitting: Radiation Oncology

## 2022-10-03 ENCOUNTER — Other Ambulatory Visit: Payer: Self-pay

## 2022-10-03 DIAGNOSIS — Z510 Encounter for antineoplastic radiation therapy: Secondary | ICD-10-CM | POA: Diagnosis not present

## 2022-10-03 LAB — RAD ONC ARIA SESSION SUMMARY
Course Elapsed Days: 42
Plan Fractions Treated to Date: 27
Plan Prescribed Dose Per Fraction: 2 Gy
Plan Total Fractions Prescribed: 36
Plan Total Prescribed Dose: 72 Gy
Reference Point Dosage Given to Date: 62 Gy
Reference Point Session Dosage Given: 2 Gy
Session Number: 31

## 2022-10-04 ENCOUNTER — Other Ambulatory Visit: Payer: Self-pay

## 2022-10-04 ENCOUNTER — Ambulatory Visit
Admission: RE | Admit: 2022-10-04 | Discharge: 2022-10-04 | Disposition: A | Discharge status: Home / Self Care | Payer: Medicare HMO | Source: Ambulatory Visit | Attending: Radiation Oncology | Admitting: Radiation Oncology

## 2022-10-04 DIAGNOSIS — Z510 Encounter for antineoplastic radiation therapy: Secondary | ICD-10-CM | POA: Diagnosis not present

## 2022-10-04 LAB — RAD ONC ARIA SESSION SUMMARY
Course Elapsed Days: 43
Plan Fractions Treated to Date: 28
Plan Prescribed Dose Per Fraction: 2 Gy
Plan Total Fractions Prescribed: 36
Plan Total Prescribed Dose: 72 Gy
Reference Point Dosage Given to Date: 64 Gy
Reference Point Session Dosage Given: 2 Gy
Session Number: 32

## 2022-10-05 ENCOUNTER — Other Ambulatory Visit: Payer: Self-pay

## 2022-10-05 ENCOUNTER — Inpatient Hospital Stay: Payer: Medicare HMO

## 2022-10-05 ENCOUNTER — Ambulatory Visit
Admission: RE | Admit: 2022-10-05 | Discharge: 2022-10-05 | Disposition: A | Discharge status: Home / Self Care | Payer: Medicare HMO | Source: Ambulatory Visit | Attending: Radiation Oncology | Admitting: Radiation Oncology

## 2022-10-05 DIAGNOSIS — Z510 Encounter for antineoplastic radiation therapy: Secondary | ICD-10-CM | POA: Diagnosis not present

## 2022-10-05 LAB — RAD ONC ARIA SESSION SUMMARY
Course Elapsed Days: 44
Plan Fractions Treated to Date: 29
Plan Prescribed Dose Per Fraction: 2 Gy
Plan Total Fractions Prescribed: 36
Plan Total Prescribed Dose: 72 Gy
Reference Point Dosage Given to Date: 66 Gy
Reference Point Session Dosage Given: 2 Gy
Session Number: 33

## 2022-10-06 ENCOUNTER — Other Ambulatory Visit: Payer: Self-pay

## 2022-10-06 ENCOUNTER — Ambulatory Visit
Admission: RE | Admit: 2022-10-06 | Discharge: 2022-10-06 | Disposition: A | Discharge status: Home / Self Care | Payer: Medicare HMO | Source: Ambulatory Visit | Attending: Radiation Oncology | Admitting: Radiation Oncology

## 2022-10-06 DIAGNOSIS — Z510 Encounter for antineoplastic radiation therapy: Secondary | ICD-10-CM | POA: Diagnosis not present

## 2022-10-06 LAB — RAD ONC ARIA SESSION SUMMARY
Course Elapsed Days: 45
Plan Fractions Treated to Date: 30
Plan Prescribed Dose Per Fraction: 2 Gy
Plan Total Fractions Prescribed: 36
Plan Total Prescribed Dose: 72 Gy
Reference Point Dosage Given to Date: 68 Gy
Reference Point Session Dosage Given: 2 Gy
Session Number: 34

## 2022-10-09 ENCOUNTER — Ambulatory Visit
Admission: RE | Admit: 2022-10-09 | Discharge: 2022-10-09 | Disposition: A | Discharge status: Home / Self Care | Payer: Medicare HMO | Source: Ambulatory Visit | Attending: Radiation Oncology | Admitting: Radiation Oncology

## 2022-10-09 ENCOUNTER — Other Ambulatory Visit: Payer: Self-pay

## 2022-10-09 DIAGNOSIS — Z510 Encounter for antineoplastic radiation therapy: Secondary | ICD-10-CM | POA: Diagnosis not present

## 2022-10-09 LAB — RAD ONC ARIA SESSION SUMMARY
Course Elapsed Days: 48
Plan Fractions Treated to Date: 31
Plan Prescribed Dose Per Fraction: 2 Gy
Plan Total Fractions Prescribed: 36
Plan Total Prescribed Dose: 72 Gy
Reference Point Dosage Given to Date: 70 Gy
Reference Point Session Dosage Given: 2 Gy
Session Number: 35

## 2022-10-10 ENCOUNTER — Ambulatory Visit
Admission: RE | Admit: 2022-10-10 | Discharge: 2022-10-10 | Disposition: A | Discharge status: Home / Self Care | Payer: Medicare HMO | Source: Ambulatory Visit | Attending: Radiation Oncology | Admitting: Radiation Oncology

## 2022-10-10 ENCOUNTER — Other Ambulatory Visit: Payer: Self-pay

## 2022-10-10 DIAGNOSIS — Z510 Encounter for antineoplastic radiation therapy: Secondary | ICD-10-CM | POA: Diagnosis not present

## 2022-10-10 LAB — RAD ONC ARIA SESSION SUMMARY
Course Elapsed Days: 49
Plan Fractions Treated to Date: 32
Plan Prescribed Dose Per Fraction: 2 Gy
Plan Total Fractions Prescribed: 36
Plan Total Prescribed Dose: 72 Gy
Reference Point Dosage Given to Date: 72 Gy
Reference Point Session Dosage Given: 2 Gy
Session Number: 36

## 2022-10-11 ENCOUNTER — Other Ambulatory Visit: Payer: Self-pay

## 2022-10-11 ENCOUNTER — Inpatient Hospital Stay: Payer: Medicare HMO | Attending: Internal Medicine

## 2022-10-11 ENCOUNTER — Ambulatory Visit
Admission: RE | Admit: 2022-10-11 | Discharge: 2022-10-11 | Disposition: A | Discharge status: Home / Self Care | Payer: Medicare HMO | Source: Ambulatory Visit | Attending: Radiation Oncology | Admitting: Radiation Oncology

## 2022-10-11 DIAGNOSIS — C61 Malignant neoplasm of prostate: Secondary | ICD-10-CM

## 2022-10-11 DIAGNOSIS — Z510 Encounter for antineoplastic radiation therapy: Secondary | ICD-10-CM | POA: Diagnosis not present

## 2022-10-11 LAB — CBC
HCT: 36.1 % — ABNORMAL LOW (ref 39.0–52.0)
Hemoglobin: 12.2 g/dL — ABNORMAL LOW (ref 13.0–17.0)
MCH: 30.9 pg (ref 26.0–34.0)
MCHC: 33.8 g/dL (ref 30.0–36.0)
MCV: 91.4 fL (ref 80.0–100.0)
Platelets: 169 10*3/uL (ref 150–400)
RBC: 3.95 MIL/uL — ABNORMAL LOW (ref 4.22–5.81)
RDW: 13 % (ref 11.5–15.5)
WBC: 3.9 10*3/uL — ABNORMAL LOW (ref 4.0–10.5)
nRBC: 0 % (ref 0.0–0.2)

## 2022-10-11 LAB — RAD ONC ARIA SESSION SUMMARY
Course Elapsed Days: 50
Plan Fractions Treated to Date: 33
Plan Prescribed Dose Per Fraction: 2 Gy
Plan Total Fractions Prescribed: 36
Plan Total Prescribed Dose: 72 Gy
Reference Point Dosage Given to Date: 74 Gy
Reference Point Session Dosage Given: 2 Gy
Session Number: 37

## 2022-10-12 ENCOUNTER — Ambulatory Visit
Admission: RE | Admit: 2022-10-12 | Discharge: 2022-10-12 | Disposition: A | Discharge status: Home / Self Care | Payer: Medicare HMO | Source: Ambulatory Visit | Attending: Radiation Oncology | Admitting: Radiation Oncology

## 2022-10-12 ENCOUNTER — Other Ambulatory Visit: Payer: Self-pay

## 2022-10-12 DIAGNOSIS — Z510 Encounter for antineoplastic radiation therapy: Secondary | ICD-10-CM | POA: Diagnosis not present

## 2022-10-12 LAB — RAD ONC ARIA SESSION SUMMARY
Course Elapsed Days: 51
Plan Fractions Treated to Date: 34
Plan Prescribed Dose Per Fraction: 2 Gy
Plan Total Fractions Prescribed: 36
Plan Total Prescribed Dose: 72 Gy
Reference Point Dosage Given to Date: 76 Gy
Reference Point Session Dosage Given: 2 Gy
Session Number: 38

## 2022-10-13 ENCOUNTER — Ambulatory Visit
Admission: RE | Admit: 2022-10-13 | Discharge: 2022-10-13 | Disposition: A | Discharge status: Home / Self Care | Payer: Medicare HMO | Source: Ambulatory Visit | Attending: Radiation Oncology | Admitting: Radiation Oncology

## 2022-10-13 ENCOUNTER — Other Ambulatory Visit: Payer: Self-pay

## 2022-10-13 DIAGNOSIS — Z510 Encounter for antineoplastic radiation therapy: Secondary | ICD-10-CM | POA: Diagnosis not present

## 2022-10-13 LAB — RAD ONC ARIA SESSION SUMMARY
Course Elapsed Days: 52
Plan Fractions Treated to Date: 35
Plan Prescribed Dose Per Fraction: 2 Gy
Plan Total Fractions Prescribed: 36
Plan Total Prescribed Dose: 72 Gy
Reference Point Dosage Given to Date: 78 Gy
Reference Point Session Dosage Given: 2 Gy
Session Number: 39

## 2022-10-16 ENCOUNTER — Ambulatory Visit
Admission: RE | Admit: 2022-10-16 | Discharge: 2022-10-16 | Disposition: A | Discharge status: Home / Self Care | Payer: Medicare HMO | Source: Ambulatory Visit | Attending: Radiation Oncology | Admitting: Radiation Oncology

## 2022-10-16 ENCOUNTER — Other Ambulatory Visit: Payer: Self-pay

## 2022-10-16 DIAGNOSIS — Z510 Encounter for antineoplastic radiation therapy: Secondary | ICD-10-CM | POA: Diagnosis not present

## 2022-10-16 LAB — RAD ONC ARIA SESSION SUMMARY
Course Elapsed Days: 55
Plan Fractions Treated to Date: 36
Plan Prescribed Dose Per Fraction: 2 Gy
Plan Total Fractions Prescribed: 36
Plan Total Prescribed Dose: 72 Gy
Reference Point Dosage Given to Date: 80 Gy
Reference Point Session Dosage Given: 2 Gy
Session Number: 40

## 2022-11-13 ENCOUNTER — Ambulatory Visit
Admission: RE | Admit: 2022-11-13 | Discharge: 2022-11-13 | Disposition: A | Discharge status: Home / Self Care | Payer: Medicare HMO | Source: Ambulatory Visit | Attending: Radiation Oncology | Admitting: Radiation Oncology

## 2022-11-13 ENCOUNTER — Other Ambulatory Visit: Payer: Medicare HMO

## 2022-11-13 VITALS — BP 111/53 | HR 48 | Temp 97.5°F | Resp 16 | Ht 72.0 in | Wt 161.5 lb

## 2022-11-13 DIAGNOSIS — C61 Malignant neoplasm of prostate: Secondary | ICD-10-CM | POA: Insufficient documentation

## 2022-11-13 NOTE — Progress Notes (Signed)
Radiation Oncology Follow up Note  Name: Curtis Quinn   Date:   11/13/2022 MRN:  [id number] DOB: Apr 27, 1941    This 82 y.o. male presents to the clinic today for 1 month follow-up status post radiation therapy to his prostate and pelvic nodes for a stage IIIb Gleason 7 adenocarcinoma presenting with a PSA of 10 and patient with prior history of ADT therapy and cryotherapy.  REFERRING PROVIDER: Marguarite Arbour, MD  HPI: Patient is a 82 year old male.  Now out 1 month having completed IMRT radiation therapy to his prostate and pelvic nodes for Gleason 7 adenocarcinoma the prostate presenting with a PSA of 10.  He had previously received some ADT therapy as well as cryotherapy.  He is seen today in routine follow-up is doing well specifically denies any increased lower urinary tract symptoms diarrhea.  He is somewhat fatigued.  COMPLICATIONS OF TREATMENT: none  FOLLOW UP COMPLIANCE: keeps appointments   PHYSICAL EXAM:  BP (!) 111/53   Pulse (!) 48   Temp (!) 97.5 F (36.4 C)   Resp 16   Ht 6' (1.829 m)   Wt 161 lb 8 oz (73.3 kg)   BMI 21.90 kg/m  Well-developed well-nourished patient in NAD. HEENT reveals PERLA, EOMI, discs not visualized.  Oral cavity is clear. No oral mucosal lesions are identified. Neck is clear without evidence of cervical or supraclavicular adenopathy. Lungs are clear to A&P. Cardiac examination is essentially unremarkable with regular rate and rhythm without murmur rub or thrill. Abdomen is benign with no organomegaly or masses noted. Motor sensory and DTR levels are equal and symmetric in the upper and lower extremities. Cranial nerves II through XII are grossly intact. Proprioception is intact. No peripheral adenopathy or edema is identified. No motor or sensory levels are noted. Crude visual fields are within normal range.  RADIOLOGY RESULTS: No current films for review  PLAN: Present time patient is doing well with low side effect profile from his image  guided IMRT radiation therapy.  And pleased with his overall progress have asked to see him back in 3 months for follow-up with a PSA at that time.  Patient is to call with any concerns at any time.  I would like to take this opportunity to thank you for allowing me to participate in the care of your patient.Carmina Miller, MD

## 2022-11-17 ENCOUNTER — Ambulatory Visit
Admission: RE | Admit: 2022-11-17 | Discharge: 2022-11-17 | Disposition: A | Discharge status: Home / Self Care | Payer: Medicare HMO | Source: Ambulatory Visit | Attending: Internal Medicine | Admitting: Internal Medicine

## 2022-11-17 DIAGNOSIS — C8510 Unspecified B-cell lymphoma, unspecified site: Secondary | ICD-10-CM | POA: Diagnosis present

## 2022-11-17 MED ORDER — IOHEXOL 300 MG/ML  SOLN
80.0000 mL | Freq: Once | INTRAMUSCULAR | Status: AC | PRN
  Administered 2022-11-17: 80 mL via INTRAVENOUS

## 2022-11-17 MED ORDER — BARIUM SULFATE 2 % PO SUSP
450.0000 mL | Freq: Once | ORAL | Status: AC
  Administered 2022-11-17: 900 mL via ORAL

## 2022-11-21 ENCOUNTER — Inpatient Hospital Stay: Payer: Medicare HMO | Attending: Internal Medicine

## 2022-11-21 ENCOUNTER — Encounter: Payer: Self-pay | Admitting: Internal Medicine

## 2022-11-21 ENCOUNTER — Inpatient Hospital Stay (HOSPITAL_BASED_OUTPATIENT_CLINIC_OR_DEPARTMENT_OTHER): Payer: Medicare HMO | Admitting: Internal Medicine

## 2022-11-21 VITALS — BP 153/76 | HR 81 | Temp 98.4°F | Ht 72.0 in | Wt 165.8 lb

## 2022-11-21 DIAGNOSIS — C8510 Unspecified B-cell lymphoma, unspecified site: Secondary | ICD-10-CM

## 2022-11-21 DIAGNOSIS — C61 Malignant neoplasm of prostate: Secondary | ICD-10-CM | POA: Diagnosis present

## 2022-11-21 DIAGNOSIS — C8303 Small cell B-cell lymphoma, intra-abdominal lymph nodes: Secondary | ICD-10-CM | POA: Diagnosis not present

## 2022-11-21 DIAGNOSIS — I480 Paroxysmal atrial fibrillation: Secondary | ICD-10-CM | POA: Insufficient documentation

## 2022-11-21 DIAGNOSIS — C7651 Malignant neoplasm of right lower limb: Secondary | ICD-10-CM

## 2022-11-21 LAB — CBC WITH DIFFERENTIAL/PLATELET
Abs Immature Granulocytes: 0.01 10*3/uL (ref 0.00–0.07)
Basophils Absolute: 0 10*3/uL (ref 0.0–0.1)
Basophils Relative: 0 %
Eosinophils Absolute: 0 10*3/uL (ref 0.0–0.5)
Eosinophils Relative: 0 %
HCT: 35.6 % — ABNORMAL LOW (ref 39.0–52.0)
Hemoglobin: 12.3 g/dL — ABNORMAL LOW (ref 13.0–17.0)
Immature Granulocytes: 0 %
Lymphocytes Relative: 17 %
Lymphs Abs: 0.5 10*3/uL — ABNORMAL LOW (ref 0.7–4.0)
MCH: 31.2 pg (ref 26.0–34.0)
MCHC: 34.6 g/dL (ref 30.0–36.0)
MCV: 90.4 fL (ref 80.0–100.0)
Monocytes Absolute: 0.2 10*3/uL (ref 0.1–1.0)
Monocytes Relative: 7 %
Neutro Abs: 2.3 10*3/uL (ref 1.7–7.7)
Neutrophils Relative %: 76 %
Platelets: 175 10*3/uL (ref 150–400)
RBC: 3.94 MIL/uL — ABNORMAL LOW (ref 4.22–5.81)
RDW: 12.8 % (ref 11.5–15.5)
WBC: 3.1 10*3/uL — ABNORMAL LOW (ref 4.0–10.5)
nRBC: 0 % (ref 0.0–0.2)

## 2022-11-21 LAB — IRON AND TIBC
Iron: 82 ug/dL (ref 45–182)
Saturation Ratios: 26 % (ref 17.9–39.5)
TIBC: 312 ug/dL (ref 250–450)
UIBC: 230 ug/dL

## 2022-11-21 LAB — COMPREHENSIVE METABOLIC PANEL
ALT: 9 U/L (ref 0–44)
AST: 18 U/L (ref 15–41)
Albumin: 4.1 g/dL (ref 3.5–5.0)
Alkaline Phosphatase: 72 U/L (ref 38–126)
Anion gap: 7 (ref 5–15)
BUN: 20 mg/dL (ref 8–23)
CO2: 25 mmol/L (ref 22–32)
Calcium: 8.6 mg/dL — ABNORMAL LOW (ref 8.9–10.3)
Chloride: 105 mmol/L (ref 98–111)
Creatinine, Ser: 0.93 mg/dL (ref 0.61–1.24)
GFR, Estimated: >60 mL/min (ref >60)
Glucose, Bld: 92 mg/dL (ref 70–99)
Potassium: 3.8 mmol/L (ref 3.5–5.1)
Sodium: 137 mmol/L (ref 135–145)
Total Bilirubin: 0.9 mg/dL (ref 0.3–1.2)
Total Protein: 6.8 g/dL (ref 6.5–8.1)

## 2022-11-21 LAB — FERRITIN: Ferritin: 42 ng/mL (ref 24–336)

## 2022-11-21 LAB — PSA: Prostatic Specific Antigen: 1.65 ng/mL (ref 0.00–4.00)

## 2022-11-21 NOTE — Progress Notes (Signed)
No concerns today.  Did have a CT scan done at [facility] since last visit.

## 2022-11-21 NOTE — Progress Notes (Signed)
[facility] OFFICE PROGRESS NOTE  Patient Care Team: Marguarite Arbour, MD as PCP - General (Internal Medicine) Earna Coder, MD as Consulting Physician (Oncology)   Cancer Staging  No matching staging information was found for the patient.   Oncology History Overview Note  Recently moved to the area from [place] He brought records from his prior urologist which were copied, scanned into the chart and reviewed Diagnosed with prostate cancer June 2018 with a PSA of 73.  11/12 cores positive prostate biopsy with Gleason 3+3/3+4  MRI showed a 4.5 cm PI-RADS 5 lesion right prostate with extracapsular extension and involvement of the right seminal vesicle Declined radiation therapy Treated with targeted cryoablation with high-dose bicalutamide and Deborra Medina x2 years [SEP 2021] He last saw his urologist in [place] November 2021 and PSA was undetectable at <0.04.  Had been off ADT for 6 months at the time of this visit At recent PCP visit his PSA was detectable at 1.67 on 01/03/2021 IMPRESSION: 1. Abnormal tracer avid soft tissue density along the inferior aspect of the right posterior renal fascia, indeterminate. Suspicious for an atypical location of prostate cancer metastasis. Differential considerations include primary soft tissue neoplasm. Consider tissue sampling. 2. Otherwise, no findings of tracer avid primary or metastatic disease. 3. Inferior right upper lobe tracer avid reticulonodular opacity and consolidation, favoring pneumonia. ---------------------------------   DIAGNOSIS:  A. SOFT TISSUE, RIGHT RETROPERITONEAL; CT-GUIDED CORE NEEDLE BIOPSY:  - CD5-NEGATIVE, CD10-NEGATIVE SMALL B-CELL LYMPHOMA.   Comment:  Core biopsy sections display sheets of relatively monomorphic small  lymphocytes with speckled chromatin and scant cytoplasm.  No significant  abnormal population of large lymphocytes is identified. No epithelioid  cell population is present.  No definite follicular architecture is  appreciated.   Immunohistochemical studies show biopsy cores to be comprised almost  exclusively of CD20+ B cells, with relatively few background CD3+ T  cells. B cells are positive for BCL2, and negative for CD5, CD10, BCL6,  MUM1, and Cyclin-D1. CD43 staining appears to match T cells.   Flow cytometric studies are significant for a CD5-, CD10- clonal B cell  population, predominantly small size by light scatter. For further  results, see scanned report in [facility].   Taken together, the findings are compatible with involvement by a CD5-,  CD10- mature B cell lymphoma. The differential diagnosis is broad, and  may favor marginal zone lymphoma, but also includes lymphoplasmacytic  lymphoma, CD10 negative follicular lymphoma, or atypical CLL. There is  limited tissue present for ancillary testing. However, excisional biopsy  may be helpful, both for evaluation of overall architecture, as well as  providing sufficient tissue for molecular/cytogenetic testing.  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 Maia Plan- March 2023-s/p evaluation at [facility]; MRI Postsurgical change status post excision of lateral mid right thigh soft tissue mass. Within the region of resection, there is a 1.5cm residual subcutaneous mass, suspicious for residual myxofibrosarcoma- currently os/p RT for 5 weeks.  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   Ambulating independently.  Curtis Quinn 82 y.o.  male  pleasant patient above history of prostate cancer; and right pelvic/retroperitoneal mass-low-grade lymphoma; and thigh sarcoma is here for follow-up/review results of CT results.  Otherwise denies any new shortness of breath or cough.  No chest pain.  Denies any lumps or bumps.   Review of Systems  Constitutional:  Negative for chills, diaphoresis, fever, malaise/fatigue and weight loss.  HENT:  Negative for nosebleeds and sore throat.   Eyes:  Negative for double vision.  Respiratory:  Negative for cough, hemoptysis, sputum production, shortness of breath and wheezing.   Cardiovascular:  Negative for chest pain, palpitations, orthopnea and leg swelling.  Gastrointestinal:  Negative for abdominal pain, blood in stool, constipation, diarrhea, heartburn, melena, nausea and vomiting.  Genitourinary:  Negative for dysuria, frequency and urgency.  Musculoskeletal:  Positive for back pain and joint pain.  Skin: Negative.  Negative for itching and rash.  Neurological:  Negative for dizziness, tingling, focal weakness, weakness and headaches.  Endo/Heme/Allergies:  Does not bruise/bleed easily.  Psychiatric/Behavioral:  Negative for depression. The patient is not nervous/anxious and does not have insomnia.       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   Heart murmur    History of kidney stones    Hypercholesteremia    Hypertension    Shingles    Tremor     PAST SURGICAL HISTORY :   Past Surgical History:  Procedure Laterality Date   FRACTURE SURGERY     bil heels-plates and screws   HARDWARE REMOVAL Right    foot   HEEL SPUR SURGERY N/A    sarcoma remvoal leg Right 01/25/2022   [facility]   TONSILLECTOMY      FAMILY HISTORY :   Family History  Problem Relation Age of Onset   Heart attack Mother    Stroke Father     SOCIAL HISTORY:   Social History   Tobacco Use   Smoking status: Never    Smokeless tobacco: Never  Vaping Use   Vaping Use: Never used  Substance Use Topics   Alcohol use: Yes    Comment: occ wine 2-3 times monthly   Drug use: Never    ALLERGIES:  is allergic to penicillins.  MEDICATIONS:  Current Outpatient Medications  Medication Sig Dispense Refill   aspirin EC (ASPIRIN ADULT LOW DOSE) 81 MG tablet      primidone (MYSOLINE) 50 MG tablet Take 50 mg by mouth in the morning and at bedtime.     simvastatin (ZOCOR) 20 MG tablet Take 1 tablet (20 mg total) by mouth at bedtime. 90 tablet 3   No current facility-administered medications for this visit.    PHYSICAL EXAMINATION: ECOG PERFORMANCE STATUS: 0 - Asymptomatic  BP (!) 153/76 (BP Location: Left Arm, Patient Position: Sitting, Cuff Size: Normal)   Pulse 81   Temp 98.4 F (36.9 C) (Tympanic)   Ht 6' (1.829 m)   Wt 165 lb 12.8 oz (75.2 kg)   SpO2 99%   BMI 22.49 kg/m   Filed Weights   11/21/22 1007  Weight: 165 lb 12.8 oz (75.2 kg)    Physical Exam Vitals and nursing note reviewed.  HENT:     Head: Normocephalic and atraumatic.     Mouth/Throat:     Pharynx: Oropharynx is clear.  Eyes:     Extraocular Movements: Extraocular movements intact.  Pupils: Pupils are equal, round, and reactive to light.  Cardiovascular:     Rate and Rhythm: Normal rate and regular rhythm.  Pulmonary:     Comments: Decreased breath sounds bilaterally.  Abdominal:     Palpations: Abdomen is soft.  Musculoskeletal:        General: Normal range of motion.     Cervical back: Normal range of motion.  Skin:    General: Skin is warm.  Neurological:     General: No focal deficit present.     Mental Status: He is alert and oriented to person, place, and time.  Psychiatric:        Behavior: Behavior normal.        Judgment: Judgment normal.      LABORATORY DATA:  I have reviewed the data as listed    Component Value Date/Time   NA 137 11/21/2022 0959   K 3.8 11/21/2022 0959   CL 105  11/21/2022 0959   CO2 25 11/21/2022 0959   GLUCOSE 92 11/21/2022 0959   BUN 20 11/21/2022 0959   CREATININE 0.93 11/21/2022 0959   CALCIUM 8.6 (L) 11/21/2022 0959   PROT 6.8 11/21/2022 0959   ALBUMIN 4.1 11/21/2022 0959   AST 18 11/21/2022 0959   ALT 9 11/21/2022 0959   ALKPHOS 72 11/21/2022 0959   BILITOT 0.9 11/21/2022 0959   GFRNONAA >60 11/21/2022 0959    No results found for: "SPEP", "UPEP"  Lab Results  Component Value Date   WBC 3.1 (L) 11/21/2022   NEUTROABS 2.3 11/21/2022   HGB 12.3 (L) 11/21/2022   HCT 35.6 (L) 11/21/2022   MCV 90.4 11/21/2022   PLT 175 11/21/2022      Chemistry      Component Value Date/Time   NA 137 11/21/2022 0959   K 3.8 11/21/2022 0959   CL 105 11/21/2022 0959   CO2 25 11/21/2022 0959   BUN 20 11/21/2022 0959   CREATININE 0.93 11/21/2022 0959      Component Value Date/Time   CALCIUM 8.6 (L) 11/21/2022 0959   ALKPHOS 72 11/21/2022 0959   AST 18 11/21/2022 0959   ALT 9 11/21/2022 0959   BILITOT 0.9 11/21/2022 0959       RADIOGRAPHIC STUDIES: I have personally reviewed the radiological images as listed and agreed with the findings in the report. No results found.   ASSESSMENT & PLAN:  Low grade B-cell lymphoma (HCC) #Low-grade B-cell lymphoma [incidental PET-prostate; OCT 2020]-  CD5-NEGATIVE, CD10-NEGATIVE SMALL B-CELL LYMPHOMA. -Differential diagnosis includes marginal zone versus lymphoplasmacytic.APRIL 19th, 2024-  Minimal residual linear soft tissue thickening lateral to the right psoas muscle, overall improved from 05/15/2022. Otherwise, no evidence recurrent lymphoma.. No evidence of metastatic disease. Will repeat  CT in 12 months; will order at next visit.   # Prostate cancer-status post cryoablation; PSA 1.15 [SEP 2022]; August 2022 [facility] PET scan-negative for any bone lesions/prostatic lesion.  PSA JULY 2023- 6.4-defer to urology; appt in dec 2023. 0 [Dr.Stoioff]; PSA pending today.   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 Hale Drone - 5000 cGy, 4/13-5/18/2023;  Surgical resection of right thigh mass with by Dr. Ranae Palms on 01/25/2022. Path = Residual myxofibrosarcoma, grade 2 or 3, negative margins.  Continue surveillance with CXR prior in 6 months. Will order at next visit.   # Paroxymal- A.fib--declined xarelto [GNFAOZH witness;Dr.Gollan]; not on anticoagulation. stable  # DISPOSITION: # follow up in 6 months- MD;  labs- cbc/cmp; iron studies; ferritin;  psa; Dr.B  # I reviewed the blood work- with the patient in detail; also reviewed the imaging independently [as summarized above]; and with the patient in detail.       No orders of the defined types were placed in this encounter.  All questions were answered. The patient knows to call the clinic with any problems, questions or concerns.      Earna Coder, MD 11/21/2022 11:55 AM

## 2022-11-21 NOTE — Assessment & Plan Note (Addendum)
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 -Differential diagnosis includes marginal zone versus lymphoplasmacytic.APRIL 19th, 2024-  Minimal residual linear soft tissue thickening lateral to the right psoas muscle, overall improved from 05/15/2022. Otherwise, no evidence recurrent lymphoma.. No evidence of metastatic disease. Will repeat  CT in 12 months; will order at next visit.   # Prostate cancer-status post cryoablation; PSA 1.15 [SEP 2022]; August 2022 [facility] PET scan-negative for any bone lesions/prostatic lesion.  PSA JULY 2023- 6.4-defer to urology; appt in dec 2023. 0 [Dr.Stoioff]; PSA pending today.   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 Hale Drone - 5000 cGy, 4/13-5/18/2023;  Surgical resection of right thigh mass with by Dr. Ranae Palms on 01/25/2022. Path = Residual myxofibrosarcoma, grade 2 or 3, negative margins.  Continue surveillance with CXR prior in 6 months. Will order at next visit.   # Paroxymal- A.fib--declined xarelto [ZOXWRUE witness;Dr.Gollan]; not on anticoagulation. stable  # DISPOSITION: # follow up in 6 months- MD;  labs- cbc/cmp; iron studies; ferritin; psa; Dr.B  # I reviewed the blood work- with the patient in detail; also reviewed the imaging independently [as summarized above]; and with the patient in detail.   # 25 minutes face-to-face with the patient discussing the above plan of care; more than 50% of time spent on prognosis/ natural history; counseling and coordination.

## 2022-12-22 ENCOUNTER — Telehealth: Payer: Self-pay | Admitting: Cardiovascular Disease

## 2022-12-22 NOTE — Telephone Encounter (Signed)
Left message to schedule an appointment  Dr Judithann Sheen wants patient to be seen for PAF ASAP

## 2023-01-11 ENCOUNTER — Encounter: Payer: Self-pay | Admitting: Cardiology

## 2023-01-11 ENCOUNTER — Ambulatory Visit (INDEPENDENT_AMBULATORY_CARE_PROVIDER_SITE_OTHER): Payer: Medicare HMO

## 2023-01-11 ENCOUNTER — Ambulatory Visit: Payer: Medicare HMO | Attending: Cardiology | Admitting: Cardiology

## 2023-01-11 VITALS — BP 116/58 | HR 66 | Ht 72.0 in | Wt 167.2 lb

## 2023-01-11 DIAGNOSIS — R002 Palpitations: Secondary | ICD-10-CM

## 2023-01-11 DIAGNOSIS — I483 Typical atrial flutter: Secondary | ICD-10-CM

## 2023-01-11 DIAGNOSIS — C499 Malignant neoplasm of connective and soft tissue, unspecified: Secondary | ICD-10-CM

## 2023-01-11 DIAGNOSIS — I10 Essential (primary) hypertension: Secondary | ICD-10-CM

## 2023-01-11 DIAGNOSIS — E782 Mixed hyperlipidemia: Secondary | ICD-10-CM

## 2023-01-11 DIAGNOSIS — R5383 Other fatigue: Secondary | ICD-10-CM | POA: Diagnosis not present

## 2023-01-11 DIAGNOSIS — C61 Malignant neoplasm of prostate: Secondary | ICD-10-CM

## 2023-01-11 NOTE — Progress Notes (Signed)
Cardiology Office Note:  .   Date:  01/11/2023  ID:  Curtis Quinn, DOB 09-27-1940, MRN [id number] PCP: Marguarite Arbour, MD  [facility] Providers Cardiologist:  Julien Nordmann, MD    History of Present Illness: .   Curtis Quinn is a 82 y.o. male with a past medical history of hypertension, hyperlipidemia, atypical chest pain (2015), history of prostate cancer, postoperative atrial flutter, sarcoma of the right leg status post XRT, who is here today for follow-up.  He had previously had chest discomfort in 2015 with a workup at that time of stress testing that revealed no infarct but did reveal increased chronotropic response and a small area of ischemia echocardiogram revealed an LVEF of 50% with an inferior wall motion abnormality.  ZIO monitor was performed and confirmed atrial fibrillation with 3% burden that he was placed on apixaban and metoprolol succinate.  He was last seen in clinic 01/16/2022 by Dr. Mariah Milling.  At that time he was requiring clearance to have skin cancer removal.  He had also had complaints of fluttering in his chest with racing heartbeats.  He had previously been diagnosed with atrial flutter noted postoperatively.  He had a right leg sarcoma that was status post XRT excised on 09/15/2021 with positive margins, pathology returned as myxofibrosarcoma, high-grade, mass measured 3.8 x 3.1 x 1.2 cm.  Surgery was planned for 01/25/2022 at [facility] with Dr. Ramon Dredge.  At that time he had stated that he had been working in his garden and had an elevated heart rate that was possible atrial fibrillation he also noted he was very anxious at the time.  Upon chart review he had not been taking his metoprolol.  Previously been prescribed for his paroxysmal tachycardia.  EKG during his visit revealed sinus bradycardia with a rate of 58.  He required no further testing prior to having his procedure.  Due to being a Jehovah's Witness he previously declined all anticoagulation.  He returns  to clinic today with complaints of extreme fatigue.  Fatigue has been ongoing for the last several months.  He has noticed that on his blood pressure At home as well that he continues to get alerts A-fib and irregular heartbeats.  He is unsure whether he is back in atrial fibrillation at this point.  He has some occasional exertional dyspnea as he is caring for his wife at home as well.  We did discuss several different conditions that he has to can cause fatigue as he has had radiation for prostate cancer, he has non-Hodgkin's,, his age, as well as his other medical history.  He stated that he was advised by his PCP that he needed to follow-up with cardiology immediately because his fatigue was abnormal.  He denies any hospitalizations or visits to the emergency department.  He had been on as needed metoprolol and has not required any use of this medication.  ROS: 10 point review of systems has been completed and considered negative with exception of what is listed in the HPI  Studies Reviewed: Marland Kitchen    EKG: Sinus rhythm with occasional PVCs with a rate of 66, left axis deviation, LVH, no acute change from prior study  TTE 12/22 done at outside facility INTERPRETATION  NORMAL LEFT VENTRICULAR SYSTOLIC FUNCTION  NORMAL RIGHT VENTRICULAR SYSTOLIC FUNCTION  MODERATE VALVULAR REGURGITATION (See above)  NO VALVULAR STENOSIS   Heart Monitor 02/2021 Patient had a min HR of 29 bpm, max HR of 144 bpm, and avg HR of 70 bpm.  Predominant underlying rhythm was Sinus Rhythm.    3 Ventricular Tachycardia runs occurred, the run with the fastest interval lasting 4 beats with a max rate of 144 bpm, the longest  lasting 4 beats with an avg rate of 106 bpm.    3 Supraventricular Tachycardia runs occurred, the run with the fastest interval lasting 5 beats with a max rate of 125 bpm, the longest lasting 9 beats with an avg rate of 102 bpm.    Atrial Fibrillation occurred  (3% burden), ranging from 35-132 bpm (avg of  67 bpm), the longest lasting 8 hours 32 mins with an avg rate of 70 bpm.    Second Degree AV Block-Mobitz I (Wenckebach) was present.    Isolated SVEs were rare (<1.0%), SVE Couplets were rare (<1.0%), and SVE  Triplets were rare (<1.0%). Isolated VEs were occasional (4.6%, 63730), VE Couplets were rare (<1.0%, 4229), and VE Triplets were rare (<1.0%, 65). Ventricular Bigeminy and Trigeminy were present.    Patient triggered events not associated with significant arrhythmia Risk Assessment/Calculations:    CHA2DS2-VASc Score = 3   This indicates a 3.2% annual risk of stroke. The patient's score is based upon: CHF History: 0 HTN History: 1 Diabetes History: 0 Stroke History: 0 Vascular Disease History: 0 Age Score: 2 Gender Score: 0            Physical Exam:   VS:  BP (!) 116/58 (BP Location: Left Arm, Patient Position: Sitting, Cuff Size: Normal)   Pulse 66   Ht 6' (1.829 m)   Wt 167 lb 3.2 oz (75.8 kg)   SpO2 97%   BMI 22.68 kg/m    Wt Readings from Last 3 Encounters:  01/11/23 167 lb 3.2 oz (75.8 kg)  11/21/22 165 lb 12.8 oz (75.2 kg)  11/13/22 161 lb 8 oz (73.3 kg)    GEN: Well nourished, well developed in no acute distress NECK: No JVD; No carotid bruits CARDIAC: Regularly irregular, no murmurs, rubs, gallops RESPIRATORY:  Clear to auscultation without rales, wheezing or rhonchi  ABDOMEN: Soft, non-tender, non-distended EXTREMITIES:  No edema; No deformity   ASSESSMENT AND PLAN: .   Fatigue that has been ongoing over the last several months.  Patient states he is continually tired for the last several months that worsened after he completed radiation treatment for his prostate cancer.  He has been scheduled for an echocardiogram to reevaluate heart function.  Paroxysmal atrial fibrillation atrial flutter patient states that he has noticed on his blood pressure monitoring that he continues to have irregular heartbeats.  Not necessarily that he feels palpitations but  he will have an occasional flutter.  He had previously in the past denied any anticoagulation due to being a Jehovah's Witness.  He is continued on aspirin 81 mg daily.  He has metoprolol titrate to take as needed for elevated heart rates but has not required the use of this medication.  Mixed hyperlipidemia. Continues to be monitored by his PCP.  He is continued on Zocor 20 mg daily.  Prostate cancer status post radiation.  He continues to be followed by oncology.  Palpitations and PVCs noted on EKG today.  Recent blood work revealed stable electrolytes.  He has been placed on a ZIO XT monitor for 2 weeks to evaluate for arrhythmia as well as A-fib/A- flutter burden.  [place] where he has completed XRT underwent surgery and further radiation.     Dispo: Patient to return to clinic to see MD/APP in 6 weeks  or sooner if needed after testing is completed to reevaluate symptoms  Signed, Khaniyah Bezek, NP

## 2023-01-11 NOTE — Patient Instructions (Signed)
Medication Instructions:  Your Physician recommend you continue on your current medication as directed.     *If you need a refill on your cardiac medications before your next appointment, please call your pharmacy*   Lab Work: None Ordered  If you have labs (blood work) drawn today and your tests are completely normal, you will receive your results only by: MyChart Message (if you have MyChart) OR A paper copy in the mail If you have any lab test that is abnormal or we need to change your treatment, we will call you to review the results.   Testing/Procedures: Your physician has requested that you have an echocardiogram. Echocardiography is a painless test that uses sound waves to create images of your heart. It provides your doctor with information about the size and shape of your heart and how well your heart's chambers and valves are working.   You may receive an ultrasound enhancing agent through an IV if needed to better visualize your heart during the echo. This procedure takes approximately one hour.  There are no restrictions for this procedure.  This will take place at [address], [place] [address]    Christena Deem- Long Term Monitor Instructions  Your physician has requested you wear a ZIO patch monitor for 14 days.  This is a single patch monitor. [employer] supplies one patch monitor per enrollment. Additional stickers are not available. Please do not apply patch if you will be having a Nuclear Stress Test,  Echocardiogram, Cardiac CT, MRI, or Chest Xray during the period you would be wearing the  monitor. The patch cannot be worn during these tests. You cannot remove and re-apply the  ZIO XT patch monitor.  Your ZIO patch monitor will be mailed 3 day USPS to your address on file. It may take 3-5 days  to receive your monitor after you have been enrolled.  Once you have received your monitor, please review the enclosed instructions. Your  monitor  has already been registered assigning a specific monitor serial # to you.  Billing and Patient Assistance Program Information  We have supplied [employer] with any of your insurance information on file for billing purposes. [employer] offers a sliding scale Patient Assistance Program for patients that do not have  insurance, or whose insurance does not completely cover the cost of the ZIO monitor.  You must apply for the Patient Assistance Program to qualify for this discounted rate.  To apply, please call [employer] at [phone], select option 4, select option 2, ask to apply for  Patient Assistance Program. Meredeth Ide will ask your household income, and how many people  are in your household. They will quote your out-of-pocket cost based on that information.  [employer] will also be able to set up a 73-month, interest-free payment plan if needed.  Applying the monitor   Shave hair from upper left chest.  Hold abrader disc by orange tab. Rub abrader in 40 strokes over the upper left chest as  indicated in your monitor instructions.  Clean area with 4 enclosed alcohol pads. Let dry.  Apply patch as indicated in monitor instructions. Patch will be placed under collarbone on left  side of chest with arrow pointing upward.  Rub patch adhesive wings for 2 minutes. Remove white label marked "1". Remove the white  label marked "2". Rub patch adhesive wings for 2 additional minutes.  While looking in a mirror, press and release button in center of patch. A small green  light will  flash 3-4 times. This will be your only indicator that the monitor has been turned on.  Do not shower for the first 24 hours. You may shower after the first 24 hours.  Press the button if you feel a symptom. You will hear a small click. Record Date, Time and  Symptom in the Patient Logbook.  When you are ready to remove the patch, follow instructions on the last 2 pages of Patient  Logbook. Stick patch monitor onto the  last page of Patient Logbook.  Place Patient Logbook in the blue and white box. Use locking tab on box and tape box closed  securely. The blue and white box has prepaid postage on it. Please place it in the mailbox as  soon as possible. Your physician should have your test results approximately 7 days after the  monitor has been mailed back to [facility].  Call [facility] Customer Care at [phone] if you have questions regarding  your ZIO XT patch monitor. Call them immediately if you see an orange light blinking on your  monitor.  If your monitor falls off in less than 4 days, contact our Monitor department at [phone].  If your monitor becomes loose or falls off after 4 days call [employer] at [phone] for  suggestions on securing your monitor    Follow-Up: At [facility], you and your health needs are our priority.  As part of our continuing mission to provide you with exceptional heart care, we have created designated Provider Care Teams.  These Care Teams include your primary Cardiologist (physician) and Advanced Practice Providers (APPs -  Physician Assistants and Nurse Practitioners) who all work together to provide you with the care you need, when you need it.  We recommend signing up for the patient portal called "MyChart".  Sign up information is provided on this After Visit Summary.  MyChart is used to connect with patients for Virtual Visits (Telemedicine).  Patients are able to view lab/test results, encounter notes, upcoming appointments, etc.  Non-urgent messages can be sent to your provider as well.   To learn more about what you can do with MyChart, go to [email or web address].    Your next appointment:   6 -8 week(s)  Provider:   You may see Julien Nordmann, MD or one of the following Advanced Practice Providers on your designated Care Team:   Nicolasa Ducking, NP Eula Listen, PA-C Cadence Fransico Michael, PA-C Charlsie Quest, NP

## 2023-01-16 ENCOUNTER — Ambulatory Visit: Payer: Medicare HMO | Attending: Cardiology

## 2023-01-16 DIAGNOSIS — R5383 Other fatigue: Secondary | ICD-10-CM

## 2023-01-16 DIAGNOSIS — I088 Other rheumatic multiple valve diseases: Secondary | ICD-10-CM

## 2023-01-16 DIAGNOSIS — Z0189 Encounter for other specified special examinations: Secondary | ICD-10-CM

## 2023-01-16 DIAGNOSIS — C61 Malignant neoplasm of prostate: Secondary | ICD-10-CM

## 2023-01-16 DIAGNOSIS — R002 Palpitations: Secondary | ICD-10-CM | POA: Diagnosis not present

## 2023-01-16 LAB — ECHOCARDIOGRAM COMPLETE
Area-P 1/2: 2.99 cm2
S' Lateral: 3.2 cm

## 2023-01-21 NOTE — Progress Notes (Signed)
Heart squeeze was noted to be 55-60% with normal function.  There is moderate leakage in the mitral valve, mild to moderate leakage of the tricuspid valve.  Further discussion on results at follow-up appointment.

## 2023-01-26 ENCOUNTER — Telehealth: Payer: Self-pay | Admitting: *Deleted

## 2023-01-26 NOTE — Telephone Encounter (Signed)
Left voicemail message to call back for review of results.  

## 2023-01-26 NOTE — Telephone Encounter (Signed)
-----   Message from Charlsie Quest, NP sent at 01/21/2023 12:09 PM EDT ----- Heart squeeze was noted to be 55-60% with normal function.  There is moderate leakage in the mitral valve, mild to moderate leakage of the tricuspid valve.  Further discussion on results at follow-up appointment.

## 2023-02-05 NOTE — Telephone Encounter (Signed)
Bryna Colander, RN 01/30/2023  2:11 PM EDT     Reviewed results with patient and he verbalized understanding with no further questions at this time.

## 2023-02-12 ENCOUNTER — Other Ambulatory Visit: Payer: Self-pay | Admitting: *Deleted

## 2023-02-12 ENCOUNTER — Other Ambulatory Visit: Payer: Self-pay

## 2023-02-12 ENCOUNTER — Inpatient Hospital Stay: Payer: Medicare HMO | Attending: Internal Medicine

## 2023-02-12 DIAGNOSIS — Z8579 Personal history of other malignant neoplasms of lymphoid, hematopoietic and related tissues: Secondary | ICD-10-CM | POA: Diagnosis not present

## 2023-02-12 DIAGNOSIS — C61 Malignant neoplasm of prostate: Secondary | ICD-10-CM

## 2023-02-12 LAB — CBC
HCT: 36.2 % — ABNORMAL LOW (ref 39.0–52.0)
Hemoglobin: 12.4 g/dL — ABNORMAL LOW (ref 13.0–17.0)
MCH: 31.2 pg (ref 26.0–34.0)
MCHC: 34.3 g/dL (ref 30.0–36.0)
MCV: 91.2 fL (ref 80.0–100.0)
Platelets: 162 10*3/uL (ref 150–400)
RBC: 3.97 MIL/uL — ABNORMAL LOW (ref 4.22–5.81)
RDW: 12.6 % (ref 11.5–15.5)
WBC: 3.2 10*3/uL — ABNORMAL LOW (ref 4.0–10.5)
nRBC: 0 % (ref 0.0–0.2)

## 2023-02-12 LAB — PSA: Prostatic Specific Antigen: 1.04 ng/mL (ref 0.00–4.00)

## 2023-02-12 NOTE — Progress Notes (Signed)
Predominant rhythm was sinus.  Minimum heart rate was 28 bpm and the maximum heart rate was 125 bpm, with average heart rate of 68 bpm.  There was 1 pause that occurred lasting 3 seconds.  Most pauses occurred overnight.  Second-degree AV block type I was present.  Possibility that this is being caused by sleep apnea.  Recommend referral to pulmonary for workup for sleep apnea.

## 2023-02-26 ENCOUNTER — Ambulatory Visit
Admission: RE | Admit: 2023-02-26 | Discharge: 2023-02-26 | Disposition: A | Discharge status: Home / Self Care | Payer: Medicare HMO | Source: Ambulatory Visit | Attending: Radiation Oncology | Admitting: Radiation Oncology

## 2023-02-26 ENCOUNTER — Encounter: Payer: Self-pay | Admitting: Radiation Oncology

## 2023-02-26 ENCOUNTER — Other Ambulatory Visit: Payer: Self-pay | Admitting: *Deleted

## 2023-02-26 VITALS — BP 146/76 | HR 60 | Temp 97.6°F | Resp 16 | Wt 163.7 lb

## 2023-02-26 DIAGNOSIS — C61 Malignant neoplasm of prostate: Secondary | ICD-10-CM | POA: Diagnosis not present

## 2023-02-26 DIAGNOSIS — C775 Secondary and unspecified malignant neoplasm of intrapelvic lymph nodes: Secondary | ICD-10-CM | POA: Diagnosis not present

## 2023-02-26 DIAGNOSIS — Z923 Personal history of irradiation: Secondary | ICD-10-CM | POA: Insufficient documentation

## 2023-02-26 NOTE — Progress Notes (Signed)
Radiation Oncology Follow up Note  Name: Curtis Quinn   Date:   02/26/2023 MRN:  [id number] DOB: August 19, 1940    This 82 y.o. male presents to the clinic today for 14-month follow-up status post IMRT T radiation therapy to his prostate and pelvic nodes for stage IIIb Gleason 7 adenocarcinoma presenting with a PSA of 10 with prior history of ADT therapy and cryotherapy.  REFERRING PROVIDER: Marguarite Arbour, MD  HPI: Patient is a 82 year old male now out for months having completed salvage radiation therapy to his prostate and pelvic nodes for a stage IIIb Gleason 7 adenocarcinoma presenting with a PSA of 10.  Seen today in routine follow-up he is doing well specifically denies any increased lower urinary tract symptoms diarrhea  or fatigue.  His most recent PSA is 1.  It does not seem he ever started ADT therapy.  COMPLICATIONS OF TREATMENT: none  FOLLOW UP COMPLIANCE: keeps appointments   PHYSICAL EXAM:  BP (!) 146/76   Pulse 60   Temp 97.6 F (36.4 C)   Resp 16   Wt 163 lb 11.2 oz (74.3 kg)   BMI 22.20 kg/m  Well-developed well-nourished patient in NAD. HEENT reveals PERLA, EOMI, discs not visualized.  Oral cavity is clear. No oral mucosal lesions are identified. Neck is clear without evidence of cervical or supraclavicular adenopathy. Lungs are clear to A&P. Cardiac examination is essentially unremarkable with regular rate and rhythm without murmur rub or thrill. Abdomen is benign with no organomegaly or masses noted. Motor sensory and DTR levels are equal and symmetric in the upper and lower extremities. Cranial nerves II through XII are grossly intact. Proprioception is intact. No peripheral adenopathy or edema is identified. No motor or sensory levels are noted. Crude visual fields are within normal range.  RADIOLOGY RESULTS: No current films for review  PLAN: Present time he seems to have had a good response as far as PSA is concerned.  Of asked to see him back in 6 months for  follow-up with a repeat PSA.  He is asked about ADT therapy and we will hold that at this time.  We can use that down the road should he have biochemical recurrence.  I would like to take this opportunity to thank you for allowing me to participate in the care of your patient.Carmina Miller, MD

## 2023-02-27 ENCOUNTER — Other Ambulatory Visit: Payer: Self-pay

## 2023-02-27 DIAGNOSIS — G4730 Sleep apnea, unspecified: Secondary | ICD-10-CM

## 2023-03-12 NOTE — Progress Notes (Unsigned)
Cardiology Office Note  Date:  03/13/2023   ID:  Curtis Quinn, DOB Dec 13, 1940, MRN [id number]  PCP:  Marguarite Arbour, MD   Chief Complaint  Patient presents with   Follow-up    Discuss cardiac testing results.  Patient denies new or acute cardiac problems/concerns today.      HPI:  Curtis Quinn is a 82 year old gentleman with past medical history of Hypertension  hyperlipidemia chest pain 2015 work-up at that time with stress test Echo: Ejection fraction 50% inferior wall motion abnormality 2015 Previously followed at [facility] hx of prostate cancer  biliary colic at surgery, seen 01/20/2021 for the evaluation of postoperative atrial flutter Given Jehovah's Witness, declined anticoagulation Who presents for office evaluation for his atrial flutter noted postoperatively  Last seen in clinic by myself 6/23 Seen by one of our colleagues June 2024  Echo ordered January 16, 2023  showing ejection fraction 55 to 60%, moderate MR, mild to moderate TR Prolapse of posterior leaflet noted  Zio monitor: no afib/flutter, NSR Pauses noted overnight on Zio Denies sleep apnea  Denies significant tachypalpitations concerning for arrhythmia Takes metoprolol to tartrate as needed for palpitations  Weight loss: down 15 pounds from 6/23 Weight loss through his cancer treatments, lots of radiation February and March 2024  EKG personally reviewed by myself on todays visit EKG Interpretation Date/Time:  Tuesday March 13 2023 09:58:54 EDT Ventricular Rate:  60 PR Interval:  200 QRS Duration:  96 QT Interval:  416 QTC Calculation: 416 R Axis:   -4  Text Interpretation: Normal sinus rhythm with sinus arrhythmia Normal ECG When compared with ECG of 20-Jan-2021 10:50, Sinus rhythm has replaced Atrial flutter Nonspecific T wave abnormality no longer evident in Inferior leads Nonspecific T wave abnormality no longer evident in Anterolateral leads Confirmed by Julien Nordmann [phone]) on  03/13/2023 10:14:07 AM    Completed surgery for sarcoma right leg, s/p XRT,  01/25/[facility] at [facility], Dr. Ranae Palms excised 09/15/21 at [facility] with positive margins, pathology returned as myxofibrosarcoma, high grade, mass measured 3.8x3.1x1.2 cm.  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  PSH:    Past Surgical History:  Procedure Laterality Date   FRACTURE SURGERY     bil heels-plates and screws   HARDWARE REMOVAL Right    foot   HEEL SPUR SURGERY N/A    sarcoma remvoal leg Right 01/25/2022   [facility]   TONSILLECTOMY      Current Outpatient Medications  Medication Sig Dispense Refill   aspirin EC (ASPIRIN ADULT LOW DOSE) 81 MG tablet      Cyanocobalamin (B-12 COMPLIANCE INJECTION) 1000 MCG/ML KIT Inject 1,000 mcg as  directed every 30 (thirty) days.     metoprolol tartrate (LOPRESSOR) 25 MG tablet Take 25 mg by mouth as needed (high heart rate).     primidone (MYSOLINE) 50 MG tablet Take 50 mg by  mouth in the morning and at bedtime.     simvastatin (ZOCOR) 20 MG tablet Take 1 tablet (20 mg total) by mouth at bedtime. 90 tablet 3   Vitamin D, Ergocalciferol, (DRISDOL) 1.25 MG (50000 UNIT) CAPS capsule Take 50,000 Units by mouth every 7 (seven) days.     No current facility-administered medications for this visit.    Allergies:   Penicillins   Social History:  The patient  reports that he has never smoked. He has never used smokeless tobacco. He reports current alcohol use. He reports that he does not use drugs.   Family History:   family history includes Heart attack in his mother; Stroke in his father.    Review of Systems: Review of Systems  Constitutional: Negative.   HENT: Negative.    Respiratory: Negative.    Cardiovascular:  Positive for palpitations.  Gastrointestinal: Negative.   Musculoskeletal: Negative.   Neurological: Negative.   Psychiatric/Behavioral: Negative.    All other systems reviewed and are negative.  PHYSICAL EXAM: VS:  BP 138/78 (BP Location: Left Arm, Patient Position: Sitting, Cuff Size: Normal)   Pulse 60   Ht 6' (1.829 m)   Wt 164 lb (74.4 kg)   SpO2 99%   BMI 22.24 kg/m  , BMI Body mass index is 22.24 kg/m. Constitutional:  oriented to person, place, and time. No distress.  HENT:  Head: Grossly normal Eyes:  no discharge. No scleral icterus.  Neck: No JVD, no carotid bruits  Cardiovascular: Regular rate and rhythm, no murmurs appreciated Pulmonary/Chest: Clear to auscultation bilaterally, no wheezes or rails Abdominal: Soft.  no distension.  no tenderness.  Musculoskeletal: Normal range of motion Neurological:  normal muscle tone. Coordination normal. No atrophy Skin: Skin warm and dry Psychiatric: normal affect, pleasant  Recent Labs: 11/21/2022: ALT 9; BUN 20; Creatinine, Ser 0.93; Potassium 3.8; Sodium 137 02/12/2023: Hemoglobin 12.4; Platelets 162    Lipid Panel No results found for: "CHOL", "HDL", "LDLCALC", "TRIG"    Wt  Readings from Last 3 Encounters:  03/13/23 164 lb (74.4 kg)  02/26/23 163 lb 11.2 oz (74.3 kg)  01/11/23 167 lb 3.2 oz (75.8 kg)     ASSESSMENT AND PLAN:  Problem List Items Addressed This Visit       Cardiology Problems   HTN (hypertension)   Hyperlipidemia, unspecified   Other Visit Diagnoses     Typical atrial flutter (HCC)    -  Primary   Relevant Orders   EKG 12-Lead (Completed)   Sleep apnea, unspecified type       Mitral valve prolapse          Atrial flutter, paroxysmal Jehovah's Witness, previously declining anticoagulation Baseline bradycardia has persisted Taking metoprolol tartrate as needed for palpitations Home monitoring using a pulse oximeter  Palpitations/PVCs Rare symptoms, metoprolol as above for symptoms  Prostate cancer Followed by primary care  Sarcoma Completed XRT, completed surgery   Total encounter time more than 30 minutes  Greater than 50% was spent in counseling and coordination of care with the patient   Signed, Dossie Arbour, M.D., Ph.D. [facility] [place], [place] [phone]

## 2023-03-13 ENCOUNTER — Encounter: Payer: Self-pay | Admitting: Cardiovascular Disease

## 2023-03-13 ENCOUNTER — Ambulatory Visit: Payer: Medicare HMO | Attending: Cardiovascular Disease | Admitting: Cardiovascular Disease

## 2023-03-13 VITALS — BP 138/78 | HR 60 | Ht 72.0 in | Wt 164.0 lb

## 2023-03-13 DIAGNOSIS — I483 Typical atrial flutter: Secondary | ICD-10-CM

## 2023-03-13 DIAGNOSIS — I10 Essential (primary) hypertension: Secondary | ICD-10-CM | POA: Diagnosis not present

## 2023-03-13 DIAGNOSIS — E782 Mixed hyperlipidemia: Secondary | ICD-10-CM

## 2023-03-13 DIAGNOSIS — G4730 Sleep apnea, unspecified: Secondary | ICD-10-CM

## 2023-03-13 DIAGNOSIS — I341 Nonrheumatic mitral (valve) prolapse: Secondary | ICD-10-CM

## 2023-03-13 MED ORDER — SIMVASTATIN 20 MG PO TABS: 20.0000 mg | ORAL_TABLET | Freq: Every day | ORAL | 3 refills | Status: DC

## 2023-03-13 NOTE — Patient Instructions (Signed)

## 2023-04-13 IMAGING — US US ABDOMEN LIMITED
1 series · 14 of 25 positions shown · non-contrast
Comparison: None.

CLINICAL DATA: Right quadrant pain

EXAM:
ULTRASOUND ABDOMEN LIMITED RIGHT UPPER QUADRANT

[Series 1: us abdomen limited ruq (liver/gb) · 14 of 70 slices shown]
[im 1/70]
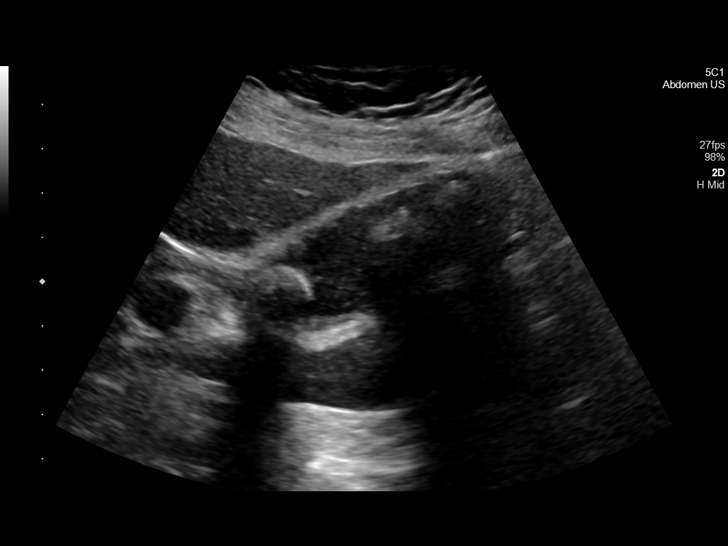
[im 6/70]
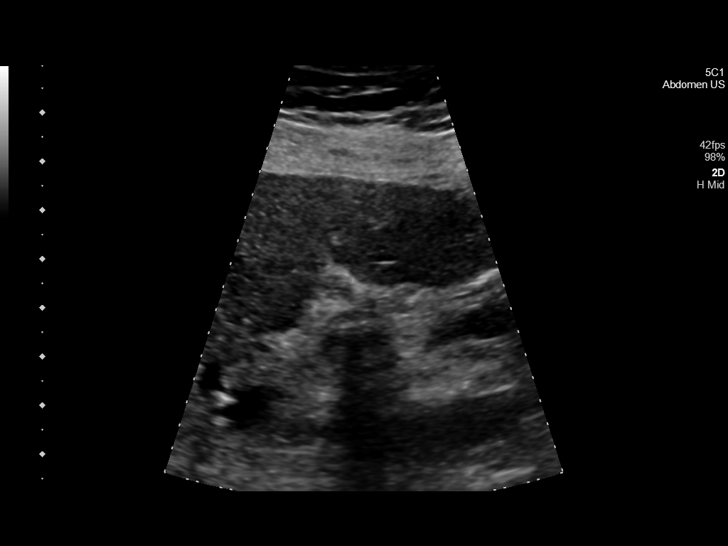
[im 12/70]
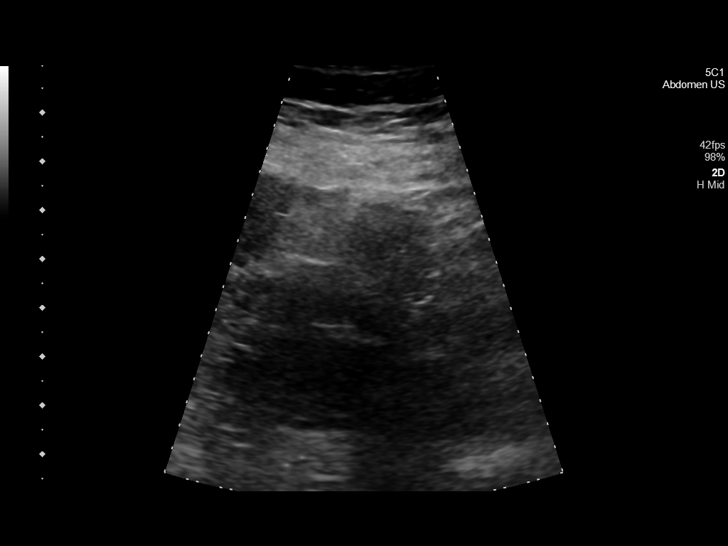
[im 18/70]
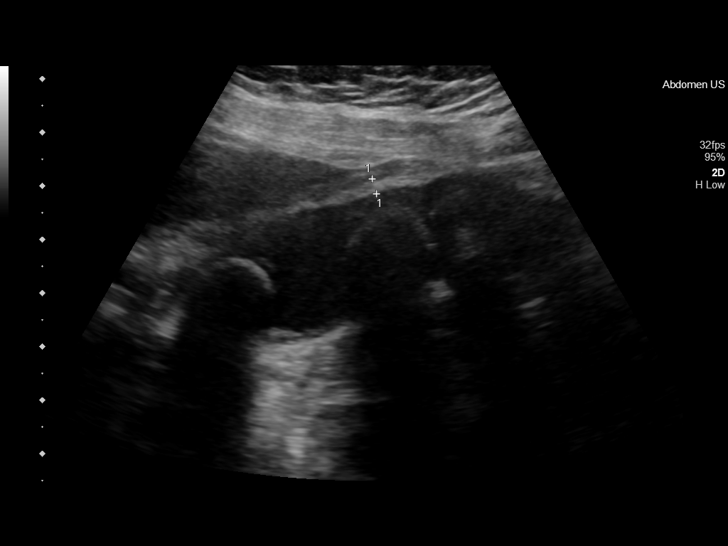
[im 24/70]
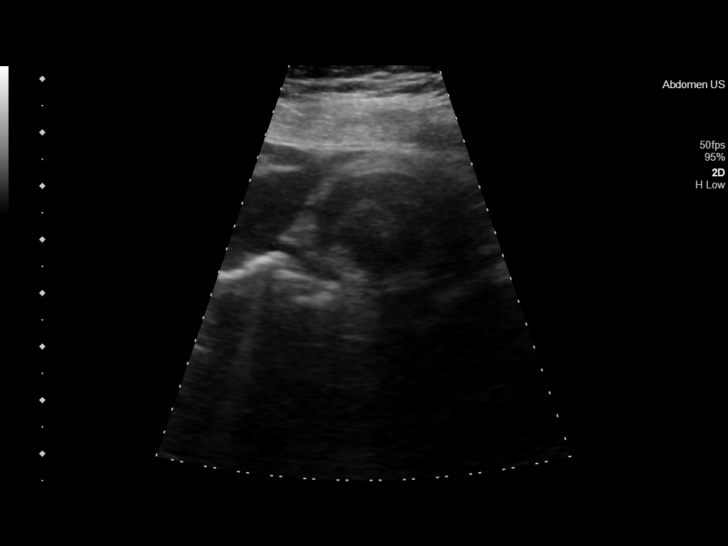
[im 26/70]
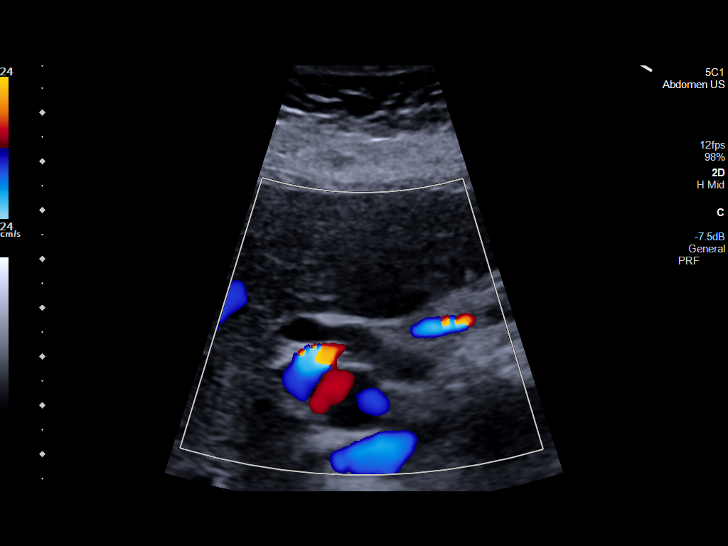
[im 32/70]
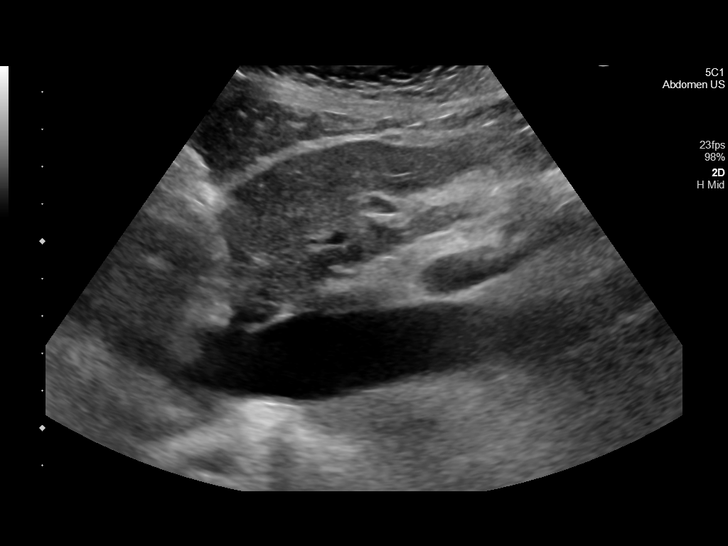
[im 38/70]
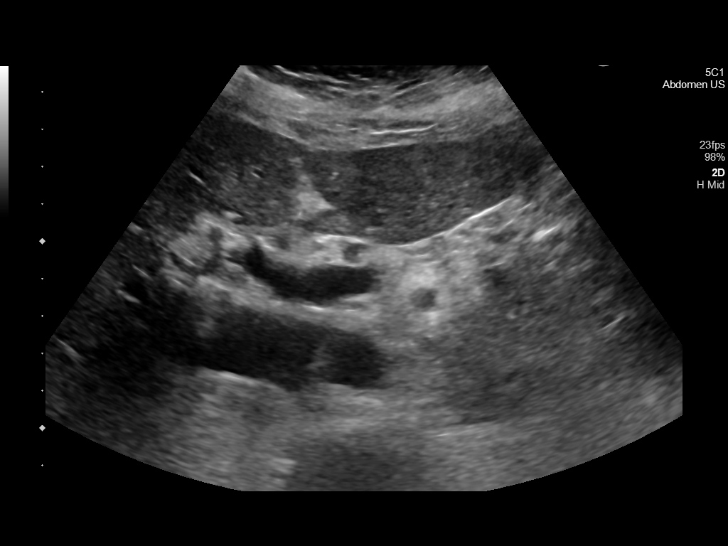
[im 44/70]
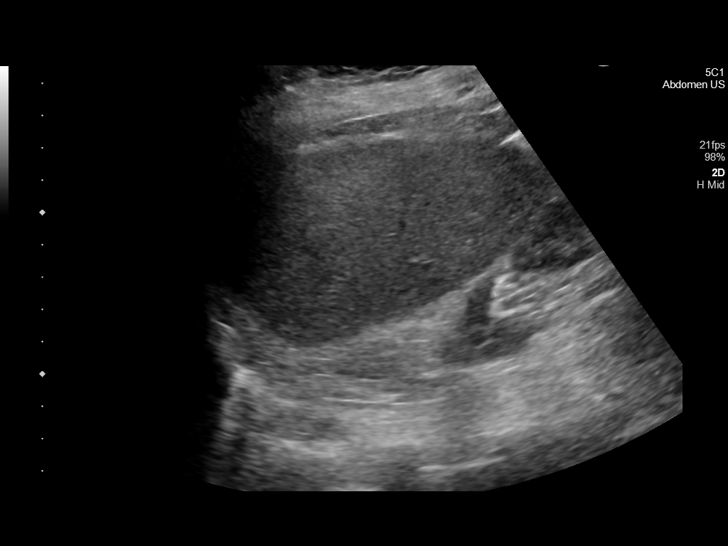
[im 47/70]
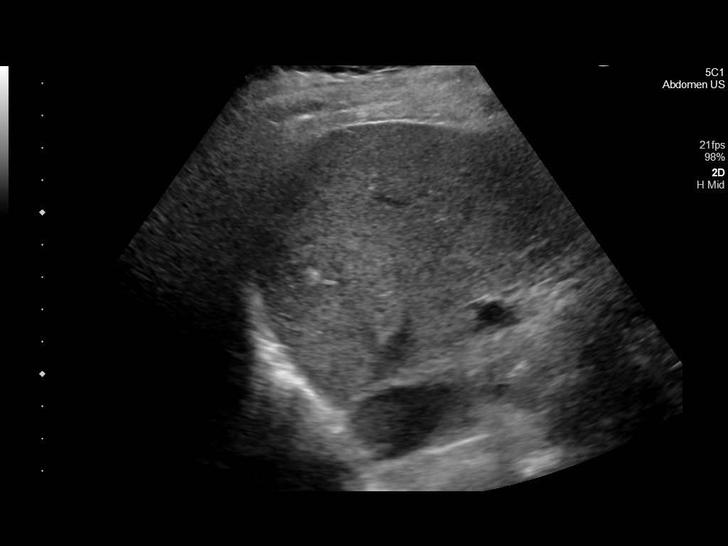
[im 52/70]
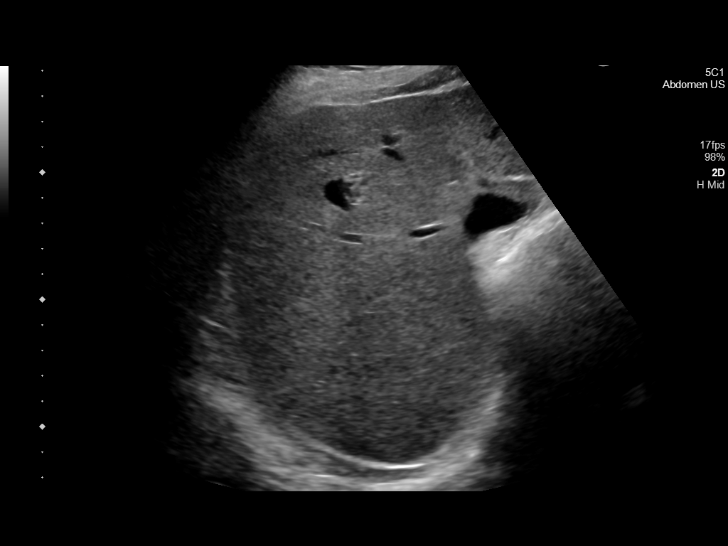
[im 58/70]
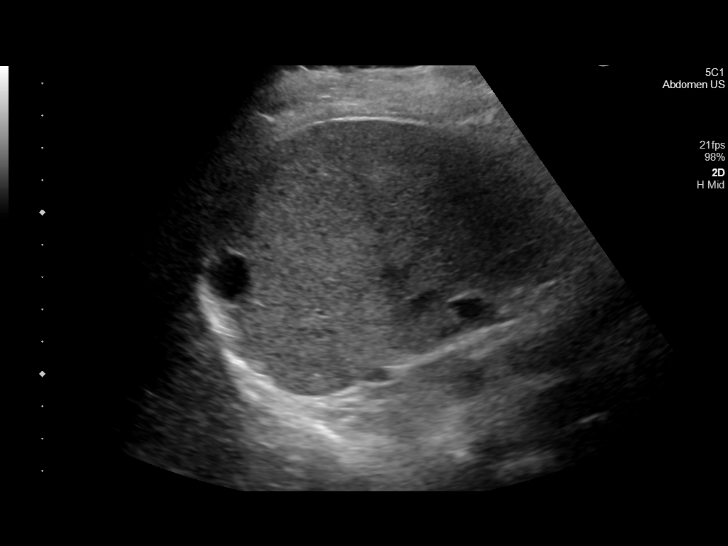
[im 64/70]
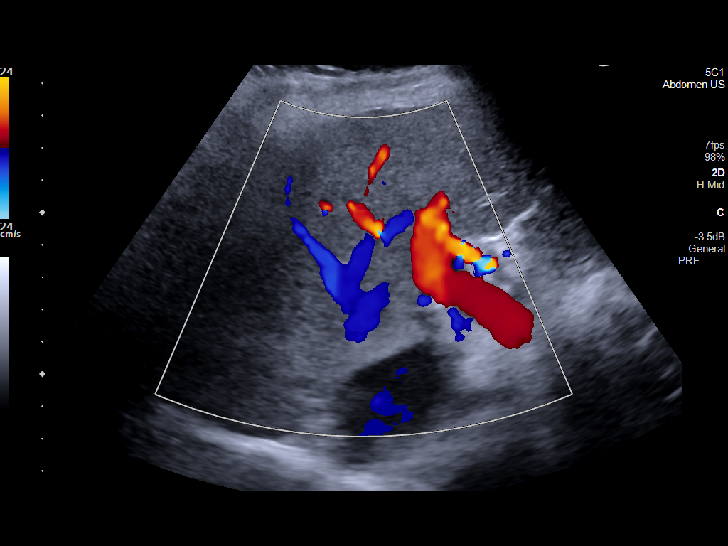
[im 70/70]
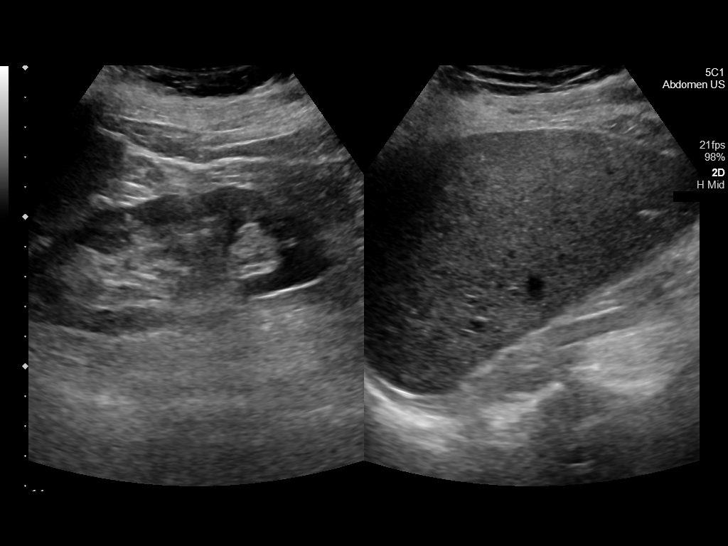

[14 of 25 positions shown; findings below may reference images not displayed]

FINDINGS: Gallbladder:

Within the gallbladder, there are multiple echogenic foci which move
and shadow consistent with cholelithiasis. Largest individual
gallstone measures 1.4 cm. There is also sludge in gallbladder.
There is no appreciable gallbladder wall thickening or
pericholecystic fluid. No sonographic Murphy sign noted by
sonographer.

Common bile duct:

Diameter: 5 mm. No intrahepatic or extrahepatic biliary duct
dilatation.

Liver:

There is a cyst in the anterior segment right lobe of the liver
measuring 1.4 x 1.6 x 1.4 cm. Liver echogenicity overall is
increased. Portal vein is patent on color Doppler imaging with
normal direction of blood flow towards the liver.

Other: None.
IMPRESSION: Cholelithiasis and sludge in gallbladder. No appreciable gallbladder
wall thickening or pericholecystic fluid. Increased liver
echogenicity, an appearance indicative of hepatic steatosis. Cyst in
right lobe of liver. No other focal liver lesions appreciable.

## 2023-05-15 ENCOUNTER — Other Ambulatory Visit: Payer: Self-pay | Admitting: *Deleted

## 2023-05-15 DIAGNOSIS — C61 Malignant neoplasm of prostate: Secondary | ICD-10-CM

## 2023-05-15 DIAGNOSIS — C8510 Unspecified B-cell lymphoma, unspecified site: Secondary | ICD-10-CM

## 2023-05-22 ENCOUNTER — Encounter: Payer: Self-pay | Admitting: Internal Medicine

## 2023-05-22 ENCOUNTER — Inpatient Hospital Stay (HOSPITAL_BASED_OUTPATIENT_CLINIC_OR_DEPARTMENT_OTHER): Payer: Medicare HMO | Admitting: Internal Medicine

## 2023-05-22 ENCOUNTER — Ambulatory Visit
Admission: RE | Admit: 2023-05-22 | Discharge: 2023-05-22 | Disposition: A | Discharge status: Home / Self Care | Payer: Medicare HMO | Source: Ambulatory Visit | Attending: Internal Medicine | Admitting: Internal Medicine

## 2023-05-22 ENCOUNTER — Inpatient Hospital Stay: Payer: Medicare HMO | Attending: Internal Medicine

## 2023-05-22 VITALS — BP 130/82 | HR 92 | Temp 97.0°F | Ht 72.0 in | Wt 158.2 lb

## 2023-05-22 DIAGNOSIS — D649 Anemia, unspecified: Secondary | ICD-10-CM | POA: Insufficient documentation

## 2023-05-22 DIAGNOSIS — Z8546 Personal history of malignant neoplasm of prostate: Secondary | ICD-10-CM | POA: Insufficient documentation

## 2023-05-22 DIAGNOSIS — Z923 Personal history of irradiation: Secondary | ICD-10-CM | POA: Insufficient documentation

## 2023-05-22 DIAGNOSIS — C8510 Unspecified B-cell lymphoma, unspecified site: Secondary | ICD-10-CM

## 2023-05-22 DIAGNOSIS — C8303 Small cell B-cell lymphoma, intra-abdominal lymph nodes: Secondary | ICD-10-CM

## 2023-05-22 DIAGNOSIS — D72810 Lymphocytopenia: Secondary | ICD-10-CM | POA: Insufficient documentation

## 2023-05-22 DIAGNOSIS — I480 Paroxysmal atrial fibrillation: Secondary | ICD-10-CM

## 2023-05-22 DIAGNOSIS — C61 Malignant neoplasm of prostate: Secondary | ICD-10-CM

## 2023-05-22 DIAGNOSIS — C4921 Malignant neoplasm of connective and soft tissue of right lower limb, including hip: Secondary | ICD-10-CM | POA: Insufficient documentation

## 2023-05-22 DIAGNOSIS — Z85831 Personal history of malignant neoplasm of soft tissue: Secondary | ICD-10-CM | POA: Diagnosis not present

## 2023-05-22 DIAGNOSIS — D72819 Decreased white blood cell count, unspecified: Secondary | ICD-10-CM

## 2023-05-22 DIAGNOSIS — C8306 Small cell B-cell lymphoma, intrapelvic lymph nodes: Secondary | ICD-10-CM | POA: Insufficient documentation

## 2023-05-22 LAB — CBC WITH DIFFERENTIAL (CANCER CENTER ONLY)
Abs Immature Granulocytes: 0 10*3/uL (ref 0.00–0.07)
Basophils Absolute: 0 10*3/uL (ref 0.0–0.1)
Basophils Relative: 0 %
Eosinophils Absolute: 0 10*3/uL (ref 0.0–0.5)
Eosinophils Relative: 1 %
HCT: 37.7 % — ABNORMAL LOW (ref 39.0–52.0)
Hemoglobin: 12.7 g/dL — ABNORMAL LOW (ref 13.0–17.0)
Immature Granulocytes: 0 %
Lymphocytes Relative: 17 %
Lymphs Abs: 0.5 10*3/uL — ABNORMAL LOW (ref 0.7–4.0)
MCH: 31 pg (ref 26.0–34.0)
MCHC: 33.7 g/dL (ref 30.0–36.0)
MCV: 92 fL (ref 80.0–100.0)
Monocytes Absolute: 0.2 10*3/uL (ref 0.1–1.0)
Monocytes Relative: 7 %
Neutro Abs: 2.3 10*3/uL (ref 1.7–7.7)
Neutrophils Relative %: 75 %
Platelet Count: 189 10*3/uL (ref 150–400)
RBC: 4.1 MIL/uL — ABNORMAL LOW (ref 4.22–5.81)
RDW: 12.9 % (ref 11.5–15.5)
WBC Count: 3.1 10*3/uL — ABNORMAL LOW (ref 4.0–10.5)
nRBC: 0 % (ref 0.0–0.2)

## 2023-05-22 LAB — CMP (CANCER CENTER ONLY)
ALT: 9 U/L (ref 0–44)
AST: 16 U/L (ref 15–41)
Albumin: 4.1 g/dL (ref 3.5–5.0)
Alkaline Phosphatase: 98 U/L (ref 38–126)
Anion gap: 7 (ref 5–15)
BUN: 23 mg/dL (ref 8–23)
CO2: 26 mmol/L (ref 22–32)
Calcium: 8.9 mg/dL (ref 8.9–10.3)
Chloride: 105 mmol/L (ref 98–111)
Creatinine: 0.97 mg/dL (ref 0.61–1.24)
GFR, Estimated: >60 mL/min (ref >60)
Glucose, Bld: 94 mg/dL (ref 70–99)
Potassium: 3.8 mmol/L (ref 3.5–5.1)
Sodium: 138 mmol/L (ref 135–145)
Total Bilirubin: 0.8 mg/dL (ref 0.3–1.2)
Total Protein: 7.5 g/dL (ref 6.5–8.1)

## 2023-05-22 LAB — IRON AND TIBC
Iron: 74 ug/dL (ref 45–182)
Saturation Ratios: 22 % (ref 17.9–39.5)
TIBC: 335 ug/dL (ref 250–450)
UIBC: 261 ug/dL

## 2023-05-22 LAB — FERRITIN: Ferritin: 56 ng/mL (ref 24–336)

## 2023-05-22 NOTE — Assessment & Plan Note (Addendum)
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 -Differential diagnosis includes marginal zone versus lymphoplasmacytic.APRIL 19th, 2024-  Minimal residual linear soft tissue thickening lateral to the right psoas muscle, overall improved from 05/15/2022. Otherwise, no evidence recurrent lymphoma.. No evidence of metastatic disease. Will repeat  CT in 12 months; will order scan today.   # Prostate cancer-status post cryoablation; PSA 1.15 [SEP 2022];-August 2022 [facility] PET scan-negative for any bone lesions/prostatic lesion.  [Dr.Stoioff];  status post IMRT T radiation therapy to  prostate and pelvic nodes for stage IIIb Gleason 7 adenocarcinoma presenting with a PSA of 10.  s/p EBRT- march 2024; awaiting PSA from today.  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 Monitor for now.   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 Hale Drone - 5000 cGy, 4/13-5/18/2023;  Surgical resection of right thigh mass with by Dr. Ranae Palms on 01/25/2022. Path = Residual myxofibrosarcoma, grade 2 or 3, negative margins.  Continue surveillance with CXR prior in 6 months. Will order today.   # Paroxymal- A.fib--declined xarelto [AOZHYQM witness;Dr.Gollan]; not on anticoagulation. stable  # DISPOSITION: # CXR today-  # follow up in 6 months- MD;  labs- cbc/cmp;  psa; CT AP-Dr.B  Cc; Dr.Sparks

## 2023-05-22 NOTE — Progress Notes (Signed)
Pt states he finished radiation therapy.  Had a carotid scan, and back xray since last visit.  Started on vit d weekly.

## 2023-05-22 NOTE — Progress Notes (Signed)
[facility] OFFICE PROGRESS NOTE  Patient Care Team: Marguarite Arbour, MD as PCP - General (Internal Medicine) Mariah Milling Tollie Pizza, MD as PCP - Cardiology (Cardiology) Earna Coder, MD as Consulting Physician (Oncology)   Cancer Staging  No matching staging information was found for the patient.    Oncology History Overview Note  Recently moved to the area from [place] He brought records from his prior urologist which were copied, scanned into the chart and reviewed Diagnosed with prostate cancer June 2018 with a PSA of 73.  11/12 cores positive prostate biopsy with Gleason 3+3/3+4  MRI showed a 4.5 cm PI-RADS 5 lesion right prostate with extracapsular extension and involvement of the right seminal vesicle Declined radiation therapy Treated with targeted cryoablation with high-dose bicalutamide and Deborra Medina x2 years [SEP 2021] He last saw his urologist in [place] November 2021 and PSA was undetectable at <0.04.  Had been off ADT for 6 months at the time of this visit At recent PCP visit his PSA was detectable at 1.67 on 01/03/2021 IMPRESSION: 1. Abnormal tracer avid soft tissue density along the inferior aspect of the right posterior renal fascia, indeterminate. Suspicious for an atypical location of prostate cancer metastasis. Differential considerations include primary soft tissue neoplasm. Consider tissue sampling. 2. Otherwise, no findings of tracer avid primary or metastatic disease. 3. Inferior right upper lobe tracer avid reticulonodular opacity and consolidation, favoring pneumonia. ---------------------------------   DIAGNOSIS:  A. SOFT TISSUE, RIGHT RETROPERITONEAL; CT-GUIDED CORE NEEDLE BIOPSY:  - CD5-NEGATIVE, CD10-NEGATIVE SMALL B-CELL LYMPHOMA.   Comment:  Core biopsy sections display sheets of relatively monomorphic small  lymphocytes with speckled chromatin and scant cytoplasm.  No significant  abnormal population of large lymphocytes is  identified. No epithelioid  cell population is present. No definite follicular architecture is  appreciated.   Immunohistochemical studies show biopsy cores to be comprised almost  exclusively of CD20+ B cells, with relatively few background CD3+ T  cells. B cells are positive for BCL2, and negative for CD5, CD10, BCL6,  MUM1, and Cyclin-D1. CD43 staining appears to match T cells.   Flow cytometric studies are significant for a CD5-, CD10- clonal B cell  population, predominantly small size by light scatter. For further  results, see scanned report in [facility].   Taken together, the findings are compatible with involvement by a CD5-,  CD10- mature B cell lymphoma. The differential diagnosis is broad, and  may favor marginal zone lymphoma, but also includes lymphoplasmacytic  lymphoma, CD10 negative follicular lymphoma, or atypical CLL. There is  limited tissue present for ancillary testing. However, excisional biopsy  may be helpful, both for evaluation of overall architecture, as well as  providing sufficient tissue for molecular/cytogenetic testing.  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 Maia Plan- March 2023-s/p evaluation at [facility]; MRI Postsurgical change status post excision of lateral mid right thigh soft tissue mass. Within the region of resection, there is a 1.5cm residual subcutaneous mass, suspicious for residual myxofibrosarcoma- currently os/p RT for 5 weeks.  Status post wide resection at [facility] 2023]-on surveillance   S/p Falloff roof  [2007-crushed feet/quivering voice- prior used to talk in church ]; Erroll Luna Witness [reluctant to take]    Prostate cancer (HCC)  04/25/2013 Initial Diagnosis   Prostate cancer (HCC)   Low grade B-cell lymphoma (HCC)  05/13/2021 Initial Diagnosis   Low grade B-cell lymphoma (HCC)      HISTORY OF PRESENT ILLNESS: He is  alone.   Ambulating independently.  Curtis Quinn 82 y.o.  male pleasant patient above history of prostate cancer; and right pelvic/retroperitoneal mass-low-grade lymphoma- on surveillance; and thigh sarcoma is here for follow-up.Marland Kitchen    states he finished radiation therapy in mrach 2024. Had a carotid scan, and back xray since last visit. Started on vit d week.   Otherwise denies any new shortness of breath or cough.  No chest pain.  Denies any lumps or bumps.   Review of Systems  Constitutional:  Negative for chills, diaphoresis, fever, malaise/fatigue and weight loss.  HENT:  Negative for nosebleeds and sore throat.   Eyes:  Negative for double vision.  Respiratory:  Negative for cough, hemoptysis, sputum production, shortness of breath and wheezing.   Cardiovascular:  Negative for chest pain, palpitations, orthopnea and leg swelling.  Gastrointestinal:  Negative for abdominal pain, blood in stool, constipation, diarrhea, heartburn, melena, nausea and vomiting.  Genitourinary:  Negative for dysuria, frequency and urgency.  Musculoskeletal:  Positive for back pain and joint pain.  Skin: Negative.  Negative for itching and rash.  Neurological:  Negative for dizziness, tingling, focal weakness, weakness and headaches.  Endo/Heme/Allergies:  Does not bruise/bleed easily.  Psychiatric/Behavioral:  Negative for depression. The patient is not nervous/anxious and does not have insomnia.       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   Heart murmur    History of kidney stones    Hypercholesteremia    Hypertension    Shingles    Tremor     PAST SURGICAL HISTORY :   Past Surgical History:  Procedure Laterality Date   FRACTURE SURGERY     bil heels-plates and screws   HARDWARE REMOVAL Right    foot   HEEL SPUR SURGERY N/A    sarcoma remvoal leg Right 01/25/2022   [facility]    TONSILLECTOMY      FAMILY HISTORY :   Family History  Problem Relation Age of Onset   Heart attack Mother    Stroke Father     SOCIAL HISTORY:   Social History   Tobacco Use   Smoking status: Never   Smokeless tobacco: Never  Vaping Use   Vaping status: Never Used  Substance Use Topics   Alcohol use: Yes    Comment: occ wine 2-3 times monthly   Drug use: Never    ALLERGIES:  is allergic to penicillins.  MEDICATIONS:  Current Outpatient Medications  Medication Sig Dispense Refill   aspirin EC (ASPIRIN ADULT LOW DOSE) 81 MG tablet      Cyanocobalamin (B-12 COMPLIANCE INJECTION) 1000 MCG/ML KIT Inject 1,000 mcg as directed every 30 (thirty) days.     metoprolol tartrate (LOPRESSOR) 25 MG tablet Take 25 mg by mouth as needed (high heart rate).     primidone (MYSOLINE) 50 MG tablet Take 50 mg by mouth in the morning and at bedtime.     simvastatin (ZOCOR) 20 MG tablet Take 1 tablet (20 mg total) by mouth at bedtime. 90 tablet 3   Vitamin D, Ergocalciferol, (DRISDOL) 1.25 MG (50000 UNIT) CAPS capsule Take 50,000 Units by mouth every 7 (seven) days.     No current facility-administered medications for this visit.    PHYSICAL EXAMINATION: ECOG PERFORMANCE STATUS: 0 - Asymptomatic  BP 130/82 (BP Location: Left Arm, Patient Position: Sitting, Cuff Size:  Normal)   Pulse 92   Temp (!) 97 F (36.1 C) (Tympanic)   Ht 6' (1.829 m)   Wt 158 lb 3.2 oz (71.8 kg)   SpO2 99%   BMI 21.46 kg/m   Filed Weights   05/22/23 0925  Weight: 158 lb 3.2 oz (71.8 kg)    Physical Exam Vitals and nursing note reviewed.  HENT:     Head: Normocephalic and atraumatic.     Mouth/Throat:     Pharynx: Oropharynx is clear.  Eyes:     Extraocular Movements: Extraocular movements intact.     Pupils: Pupils are equal, round, and reactive to light.  Cardiovascular:     Rate and Rhythm: Normal rate and regular rhythm.  Pulmonary:     Comments: Decreased breath sounds bilaterally.  Abdominal:      Palpations: Abdomen is soft.  Musculoskeletal:        General: Normal range of motion.     Cervical back: Normal range of motion.  Skin:    General: Skin is warm.  Neurological:     General: No focal deficit present.     Mental Status: He is alert and oriented to person, place, and time.  Psychiatric:        Behavior: Behavior normal.        Judgment: Judgment normal.      LABORATORY DATA:  I have reviewed the data as listed    Component Value Date/Time   NA 138 05/22/2023 0925   K 3.8 05/22/2023 0925   CL 105 05/22/2023 0925   CO2 26 05/22/2023 0925   GLUCOSE 94 05/22/2023 0925   BUN 23 05/22/2023 0925   CREATININE 0.97 05/22/2023 0925   CALCIUM 8.9 05/22/2023 0925   PROT 7.5 05/22/2023 0925   ALBUMIN 4.1 05/22/2023 0925   AST 16 05/22/2023 0925   ALT 9 05/22/2023 0925   ALKPHOS 98 05/22/2023 0925   BILITOT 0.8 05/22/2023 0925   GFRNONAA >60 05/22/2023 0925    No results found for: "SPEP", "UPEP"  Lab Results  Component Value Date   WBC 3.1 (L) 05/22/2023   NEUTROABS 2.3 05/22/2023   HGB 12.7 (L) 05/22/2023   HCT 37.7 (L) 05/22/2023   MCV 92.0 05/22/2023   PLT 189 05/22/2023      Chemistry      Component Value Date/Time   NA 138 05/22/2023 0925   K 3.8 05/22/2023 0925   CL 105 05/22/2023 0925   CO2 26 05/22/2023 0925   BUN 23 05/22/2023 0925   CREATININE 0.97 05/22/2023 0925      Component Value Date/Time   CALCIUM 8.9 05/22/2023 0925   ALKPHOS 98 05/22/2023 0925   AST 16 05/22/2023 0925   ALT 9 05/22/2023 0925   BILITOT 0.8 05/22/2023 0925       RADIOGRAPHIC STUDIES: I have personally reviewed the radiological images as listed and agreed with the findings in the report. No results found.   ASSESSMENT & PLAN:  Low grade B-cell lymphoma (HCC) #Low-grade B-cell lymphoma [incidental PET-prostate; OCT 2020]-  CD5-NEGATIVE, CD10-NEGATIVE SMALL B-CELL LYMPHOMA. -Differential diagnosis includes marginal zone versus lymphoplasmacytic.APRIL  19th, 2024-  Minimal residual linear soft tissue thickening lateral to the right psoas muscle, overall improved from 05/15/2022. Otherwise, no evidence recurrent lymphoma.. No evidence of metastatic disease. Will repeat  CT in 12 months; will order scan today.   # Prostate cancer-status post cryoablation; PSA 1.15 [SEP 2022];-August 2022 [facility] PET scan-negative for any bone lesions/prostatic lesion.  [Dr.Stoioff];  status post IMRT T radiation therapy to  prostate and pelvic nodes for stage IIIb Gleason 7 adenocarcinoma presenting with a PSA of 10.  s/p EBRT- march 2024; awaiting PSA from today.  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 Monitor for now.   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 Hale Drone - 5000 cGy, 4/13-5/18/2023;  Surgical resection of right thigh mass with by Dr. Ranae Palms on 01/25/2022. Path = Residual myxofibrosarcoma, grade 2 or 3, negative margins.  Continue surveillance with CXR prior in 6 months. Will order today.   # Paroxymal- A.fib--declined xarelto [[id number] witness;Dr.Gollan]; not on anticoagulation. stable  # DISPOSITION: # CXR today-  # follow up in 6 months- MD;  labs- cbc/cmp;  psa; CT AP-Dr.B  Cc; Dr.Sparks       Orders Placed This Encounter  Procedures   CT ABDOMEN PELVIS W CONTRAST    Standing Status:   Future    Standing Expiration Date:   05/21/2024    Order Specific Question:   If indicated for the ordered procedure, I authorize the administration of contrast media per Radiology protocol    Answer:   Yes    Order Specific Question:   Does the patient have a contrast media/X-ray dye allergy?    Answer:   No    Order Specific Question:   Preferred imaging location?    Answer:   [facility]    Order Specific Question:   If indicated for the ordered procedure, I authorize the administration of oral contrast media per Radiology protocol    Answer:   Yes   DG Chest 2 View     Standing Status:   Future    Number of Occurrences:   1    Standing Expiration Date:   05/21/2024    Order Specific Question:   Reason for Exam (SYMPTOM  OR DIAGNOSIS REQUIRED)    Answer:   Hx of sarcoma- surveilleinace    Order Specific Question:   Preferred imaging location?    Answer:   [facility]   CBC with Differential (Cancer Center Only)    Standing Status:   Future    Standing Expiration Date:   05/21/2024   CMP (Cancer Center only)    Standing Status:   Future    Standing Expiration Date:   05/21/2024   PSA    Standing Status:   Future    Standing Expiration Date:   05/21/2024   All questions were answered. The patient knows to call the clinic with any problems, questions or concerns.      Earna Coder, MD 05/22/2023 10:32 AM

## 2023-05-23 ENCOUNTER — Other Ambulatory Visit: Payer: Medicare HMO

## 2023-05-23 ENCOUNTER — Ambulatory Visit: Payer: Medicare HMO | Admitting: Internal Medicine

## 2023-05-23 LAB — PSA: Prostatic Specific Antigen: 0.55 ng/mL (ref 0.00–4.00)

## 2023-07-21 IMAGING — US US EXTREM LOW*R* LIMITED
1 series · 9 of 9 positions shown · non-contrast
Comparison: None.

CLINICAL DATA: Right thigh mass

EXAM:
ULTRASOUND RIGHT LOWER EXTREMITY LIMITED
TECHNIQUE: Ultrasound examination of the lower extremity soft tissues was
performed in the area of clinical concern.

[Series 1: us extrem low bilat ltd · 9 acquisitions, 9 frames shown]
[im 1/9]
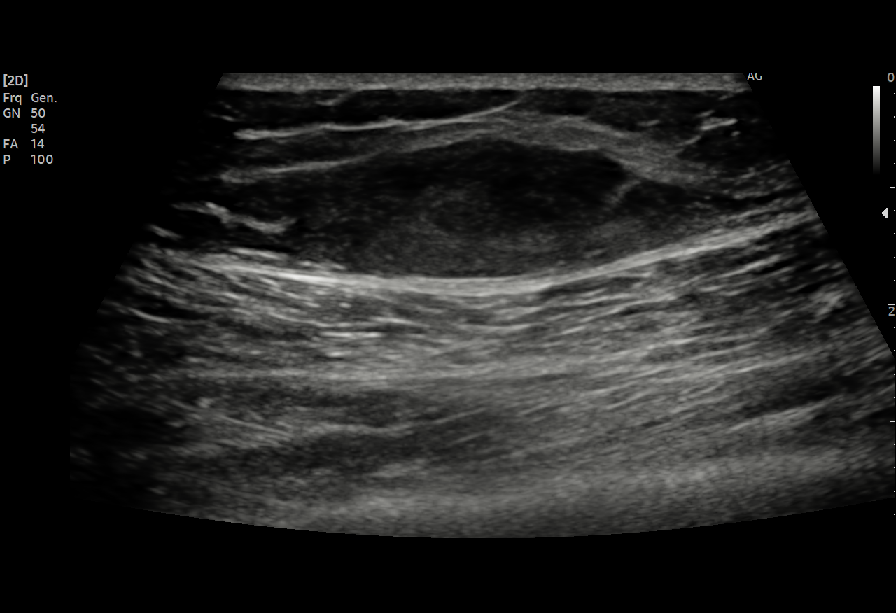
[im 2/9]
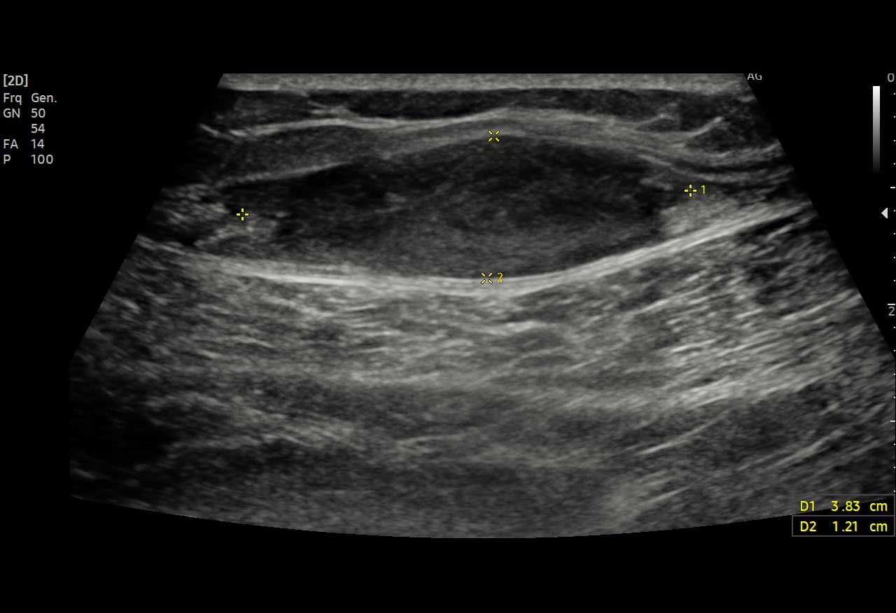
[im 3/9]
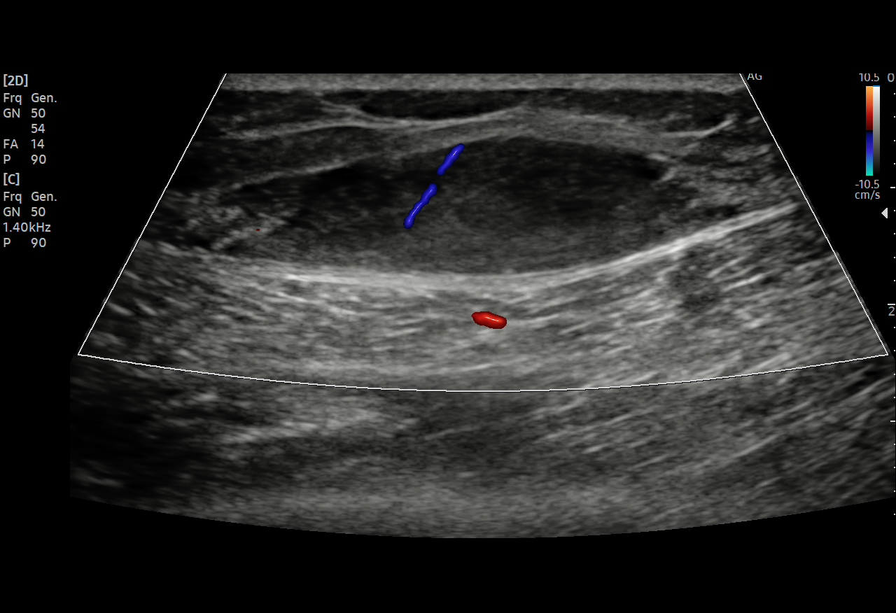
[im 4/9]
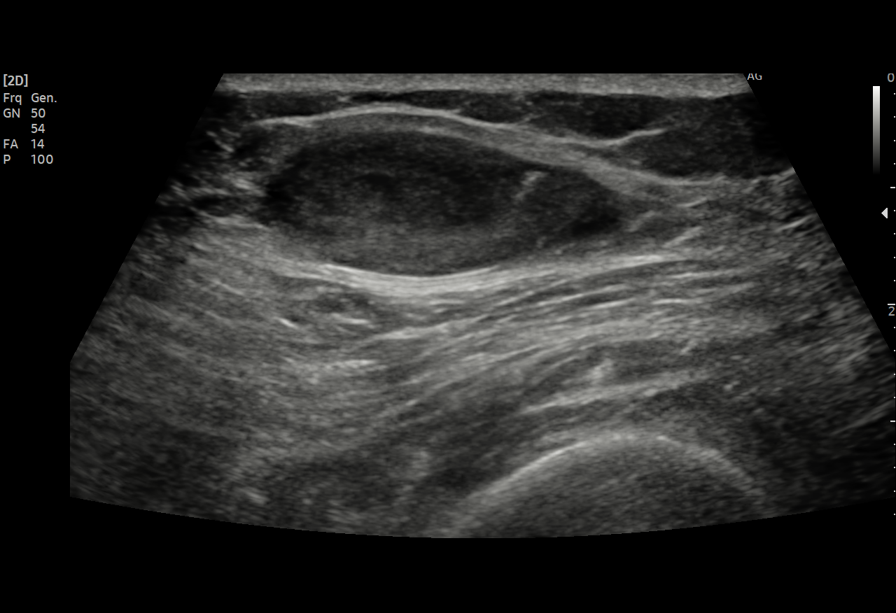
[im 5/9]
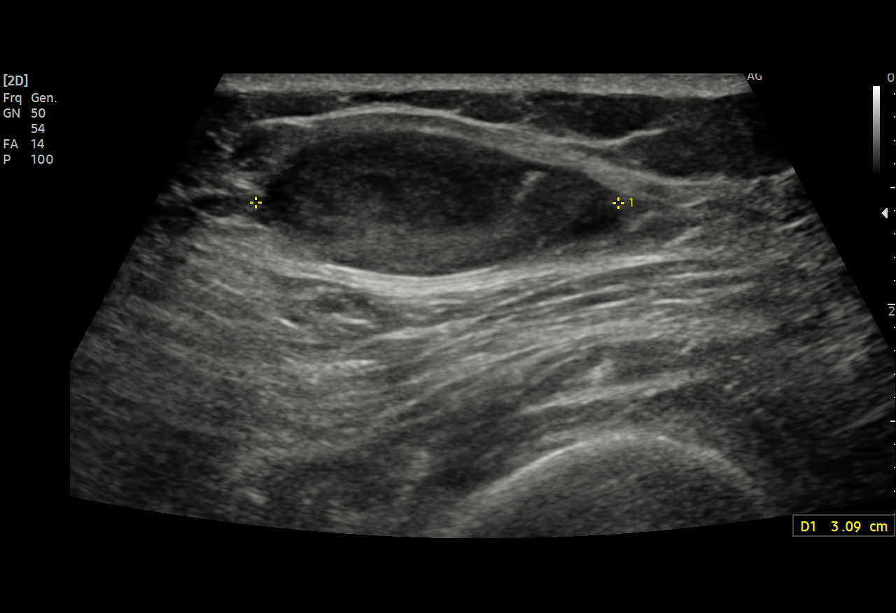
[im 6/9]
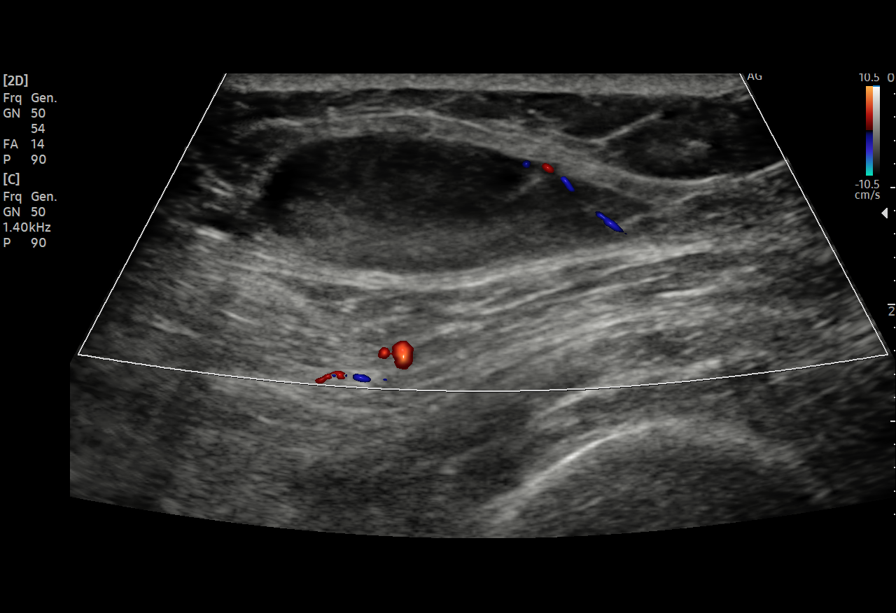
[im 7/9]
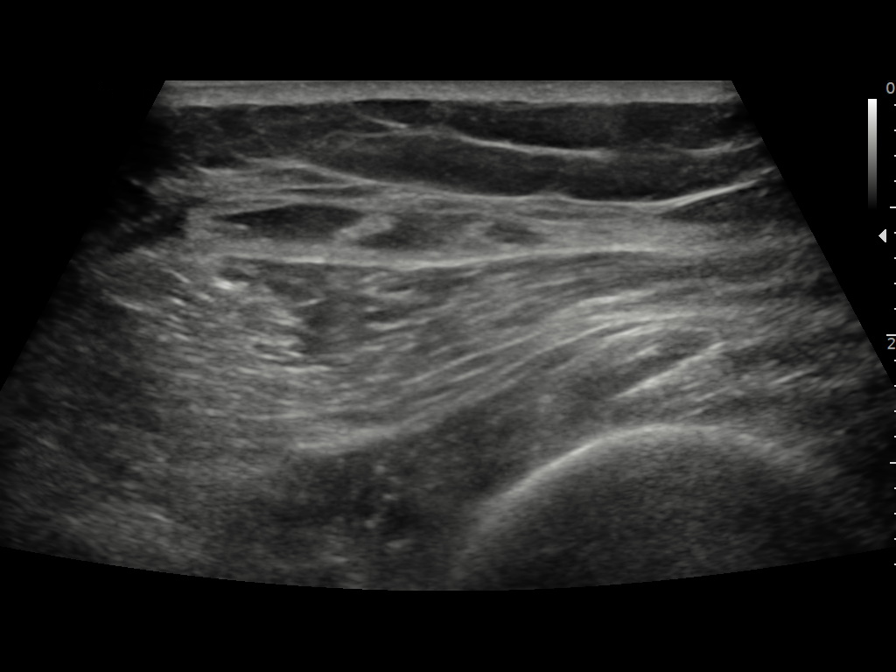
[im 8/9]
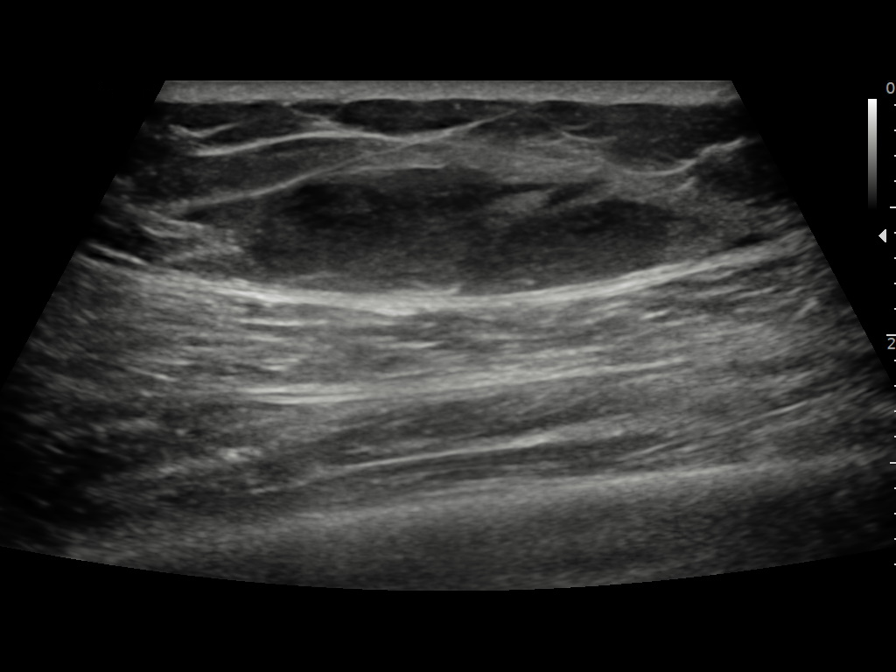
[im 9/9]
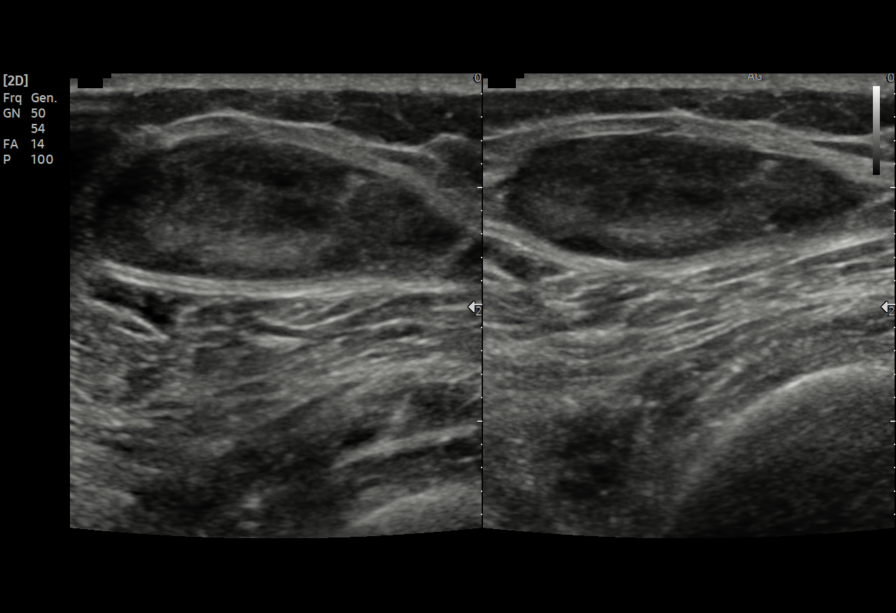

[9 of 9 positions shown; findings below may reference images not displayed]

FINDINGS: Focused sonographic exam along the lateral right lower extremity in
the area of interest was performed. This demonstrates a
well-circumscribed hypoechoic lesion in the subcutaneous tissues
measuring 3.8 x 3.1 x 1.2 cm. It appears to blend in with the
subcutaneous fat (see image [DATE]), and is of similar echogenicity. It
demonstrates minimal internal vascularity. Findings are most
consistent with a subcutaneous lipoma.
IMPRESSION: There is a 3.8 cm soft tissue lesion in the lateral right lower
extremity, most compatible with a subcutaneous lipoma.

## 2023-08-22 ENCOUNTER — Other Ambulatory Visit: Payer: Medicare HMO

## 2023-08-29 ENCOUNTER — Ambulatory Visit: Payer: Medicare HMO | Admitting: Radiation Oncology

## 2023-11-06 ENCOUNTER — Ambulatory Visit: Payer: Medicare HMO

## 2023-11-15 ENCOUNTER — Other Ambulatory Visit: Payer: Self-pay | Admitting: Internal Medicine

## 2023-11-15 DIAGNOSIS — R413 Other amnesia: Secondary | ICD-10-CM

## 2023-11-20 ENCOUNTER — Ambulatory Visit: Payer: Medicare HMO | Admitting: Internal Medicine

## 2023-11-20 ENCOUNTER — Other Ambulatory Visit: Payer: Medicare HMO

## 2023-11-20 ENCOUNTER — Ambulatory Visit
Admission: RE | Admit: 2023-11-20 | Discharge: 2023-11-20 | Disposition: A | Discharge status: Home / Self Care | Source: Ambulatory Visit | Attending: Internal Medicine | Admitting: Internal Medicine

## 2023-11-20 DIAGNOSIS — R413 Other amnesia: Secondary | ICD-10-CM | POA: Diagnosis present

## 2024-02-15 ENCOUNTER — Telehealth: Payer: Self-pay | Admitting: Cardiovascular Disease

## 2024-02-15 NOTE — Telephone Encounter (Signed)
 Fax was sent per Dr Auston to schedule with [facility] Left message on voice mail to call and schedule

## 2024-02-17 IMAGING — CT CT CHEST-ABD-PELV W/ CM
2 of 5 series · 12 of 36 positions shown, 14 images · IV contrast (agent unspecified)
Comparison: PET-CT April 05, 2021

CLINICAL DATA: History of low-grade B-cell lymphoma, follow-up.
Ongoing therapy.

* Tracking Code: BO *
EXAM:
CT CHEST, ABDOMEN, AND PELVIS WITH CONTRAST
TECHNIQUE: Multidetector CT imaging of the chest, abdomen and pelvis was
performed following the standard protocol during bolus
administration of intravenous contrast.

[Series 2: cap with · axial · 0.69mm/px · z∈[-943,-418]mm · 9 of 133 slices shown, 11 images]
[im 14/133  mediastinal]
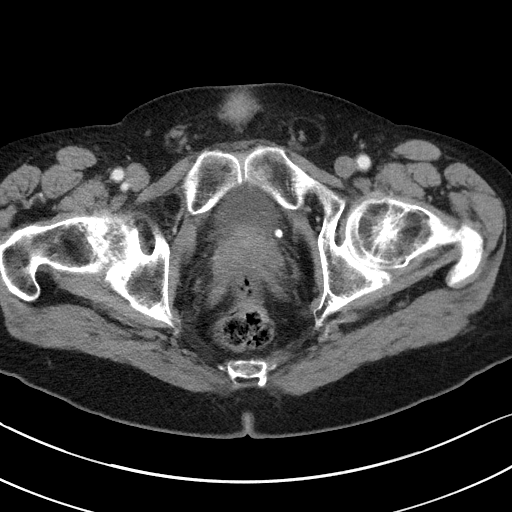
[im 14/133  bone]
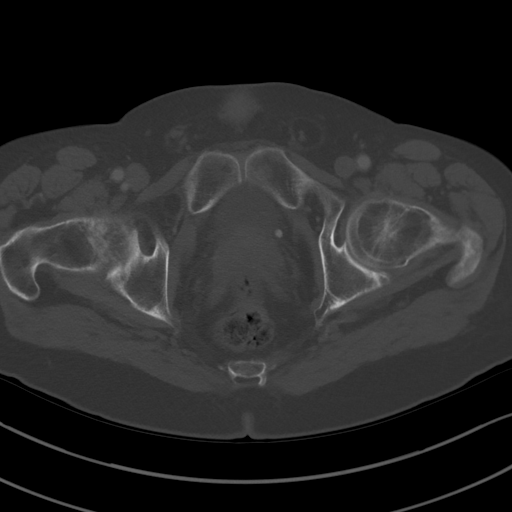
[im 27/133  mediastinal]
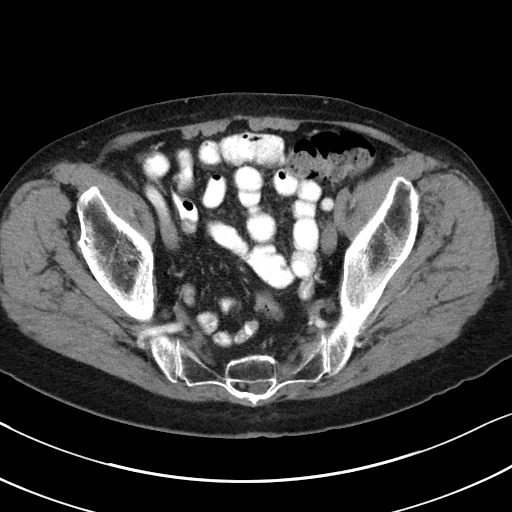
[im 40/133  mediastinal]
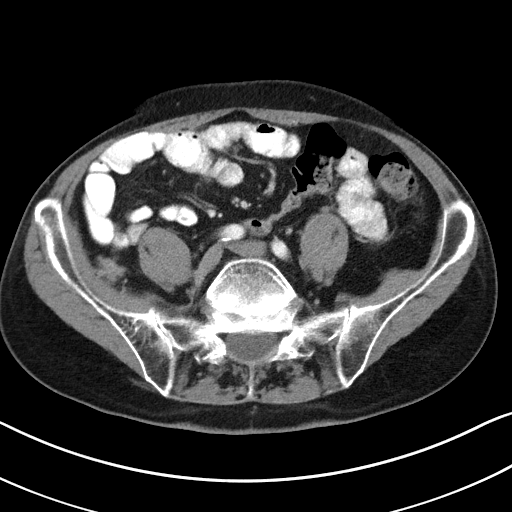
[im 53/133  mediastinal]
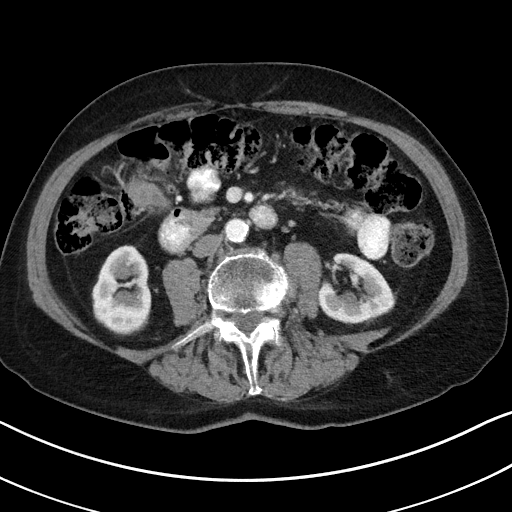
[im 67/133  mediastinal]
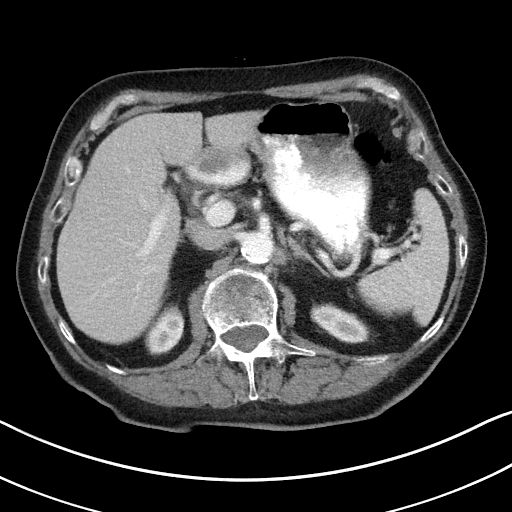
[im 80/133  mediastinal]
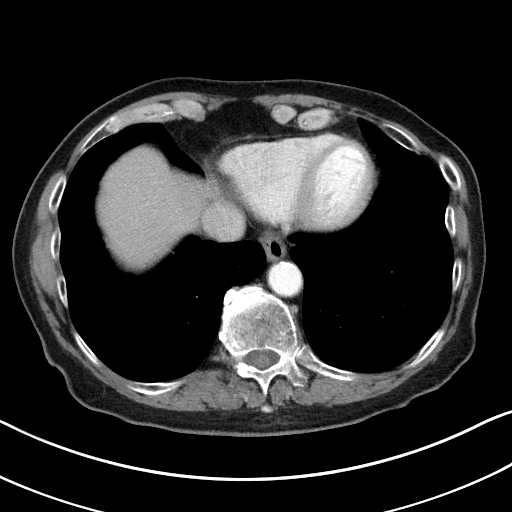
[im 93/133  mediastinal]
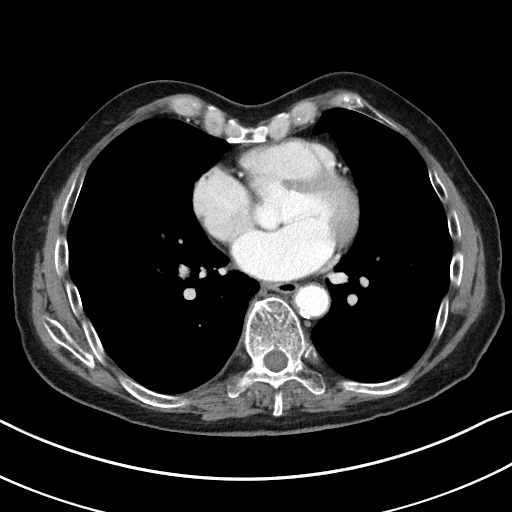
[im 106/133  mediastinal]
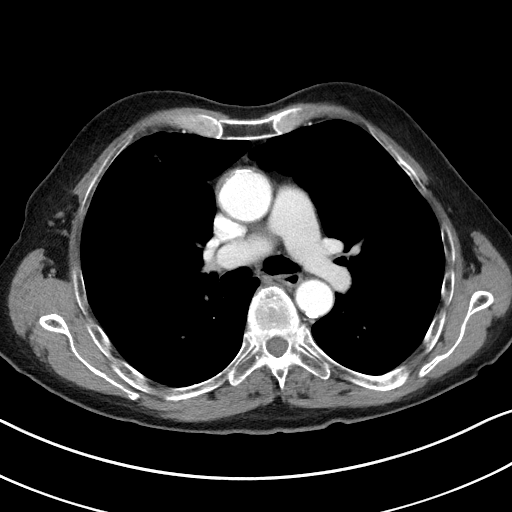
[im 119/133  mediastinal]
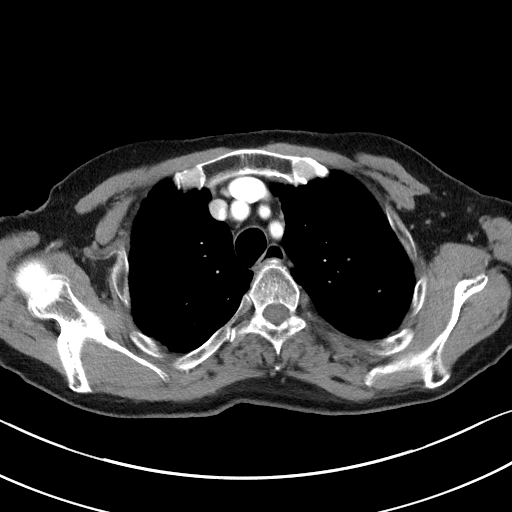
[im 119/133  bone]
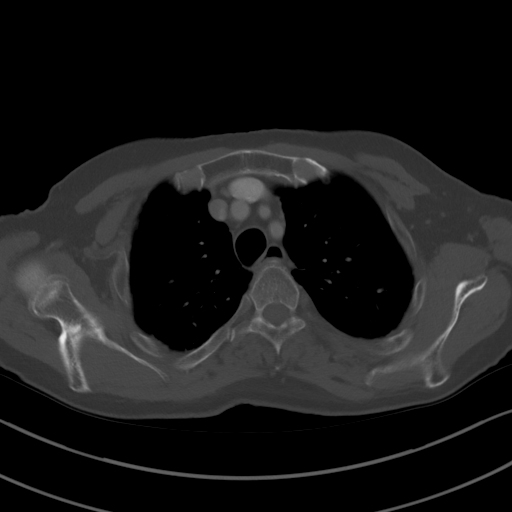

[Series 5: coronals · coronal · 0.71mm/px · 3 of 129 slices shown]
[im 26/129  mediastinal]
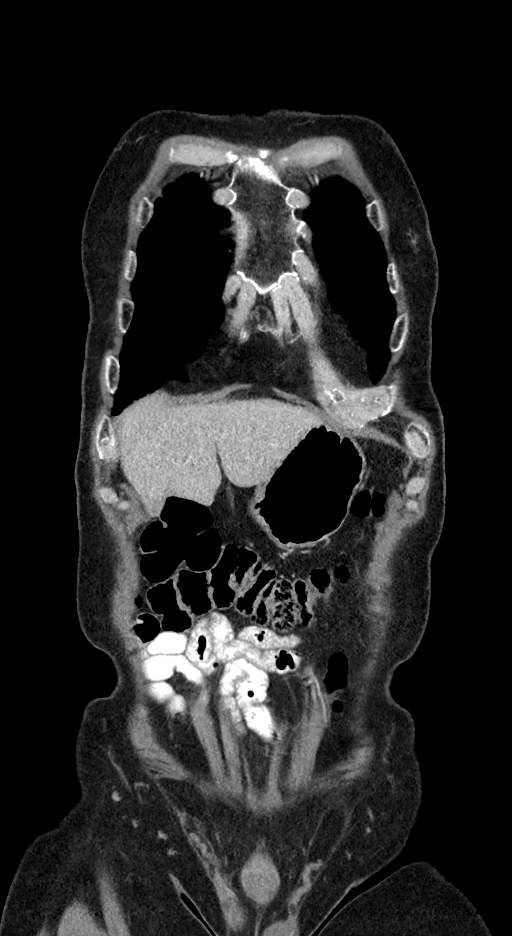
[im 52/129  mediastinal]
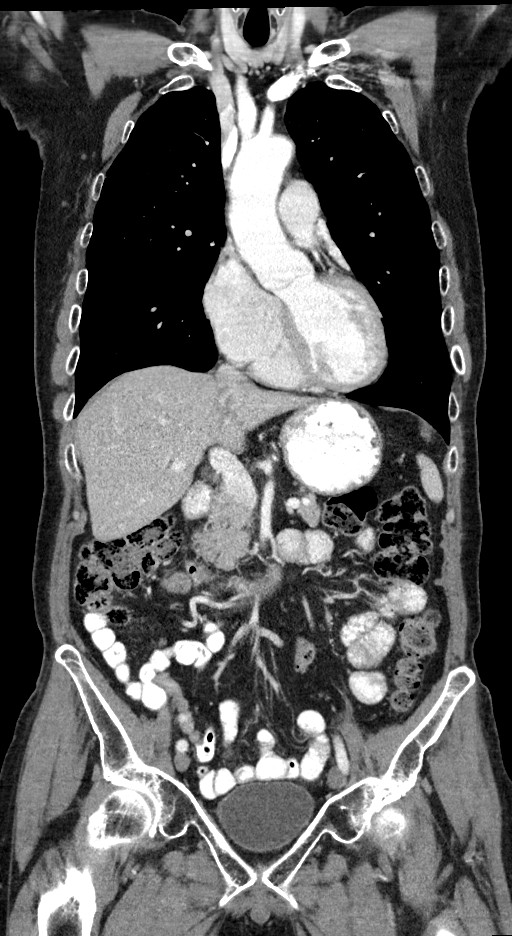
[im 77/129  mediastinal]
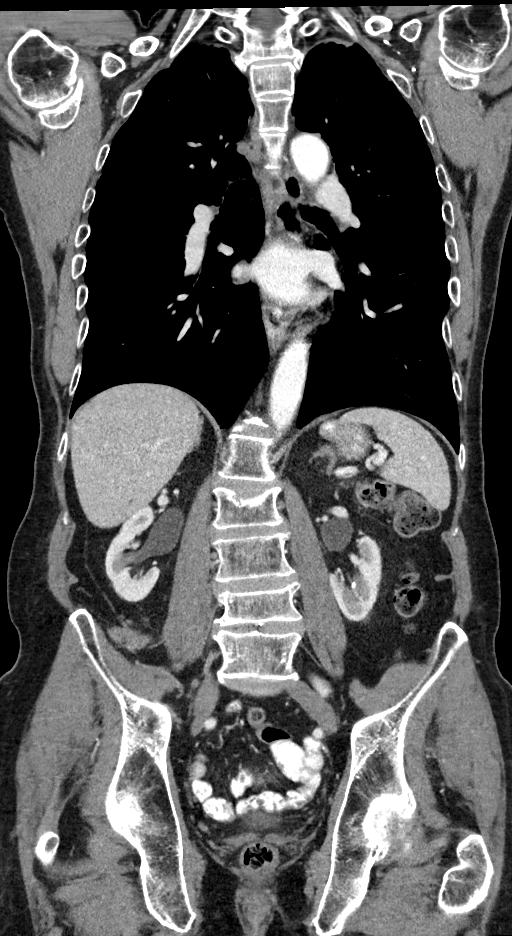

[12 of 36 positions shown; findings below may reference images not displayed]

RADIATION DOSE REDUCTION: This exam was performed according to the
departmental dose-optimization program which includes automated
exposure control, adjustment of the mA and/or kV according to
patient size and/or use of iterative reconstruction technique.

CONTRAST:  100mL OMNIPAQUE IOHEXOL 300 MG/ML  SOLN
FINDINGS: CT CHEST FINDINGS

Cardiovascular: Aortic atherosclerosis without aneurysmal dilation.
No central pulmonary embolus on this nondedicated study. Mild
cardiac enlargement. No significant pericardial effusion/thickening.

Mediastinum/Nodes: No supraclavicular adenopathy. No discrete
thyroid nodule. No pathologically enlarged mediastinal, hilar or
axillary lymph nodes. Esophagus is grossly unremarkable.

Lungs/Pleura: Linear opacity in the right upper lobe with associated
bronchiectasis/bronchiolectasis most likely reflects
sequela/scarring from prior infection/inflammation.

There are few scattered tiny pulmonary nodules for instance a 2 mm
right lower lobe pulmonary nodule on image 89/4 and a 3 mm left
lower lobe perifissural pulmonary nodule on image 86/4.

Similar moderate biapical pleuroparenchymal scarring. No pleural
effusion. No pneumothorax.

Musculoskeletal: No aggressive lytic or blastic lesion of bone.
Multilevel degenerative changes of the spine. Degenerative changes
of the bilateral shoulders. Remote left rib fractures. Bilateral
gynecomastia.

CT ABDOMEN PELVIS FINDINGS

Hepatobiliary: Hypodense 10 mm hepatic lesion is unchanged from
prior and measures fluid density consistent with a hepatic cyst.
Gallbladder is not visualized and may be surgically absent. No
biliary ductal dilation.

Pancreas: No pancreatic ductal dilation or evidence of acute
inflammation.

Spleen: No splenomegaly or focal splenic lesion.

Adrenals/Urinary Tract: Bilateral adrenal glands appear normal.
Nonobstructive 3 mm left lower pole renal cyst. Bilateral extrarenal
pelves. No hydronephrosis. Kidneys demonstrate symmetric enhancement
and excretion of contrast material. Urinary bladder is unremarkable
for degree of distension.

Stomach/Bowel: Radiopaque enteric contrast material traverses distal
loops of small bowel. The stomach is unremarkable for degree of
distension. No pathologic dilation of small or large bowel. No
evidence of acute bowel inflammation. Moderate volume of formed
stool throughout the colon suggestive of constipation.

Vascular/Lymphatic: Aortic and branch vessel atherosclerosis without
abdominal aortic aneurysm.

Unchanged size of the soft tissue along the inferior aspect of the
right posterior renal fascia lateral to the right psoas muscle which
was hypermetabolic on prior PET-CT and on today's study measures
x 1.6 cm previously 4.1 x 1.7 cm.

No gastrohepatic or hepatoduodenal ligament lymphadenopathy. No
retroperitoneal or mesenteric lymphadenopathy. No pelvic sidewall
lymphadenopathy. No groin lymphadenopathy.

Reproductive: Prostate is unremarkable.

Other: No significant abdominopelvic free fluid.

Musculoskeletal: No aggressive lytic or blastic lesion of bone.
Diffuse demineralization of bone. Bilateral L5 pars defects.
Compression deformities at L2 and L4.
IMPRESSION: 1. Stable 4.1 x 1.6 cm soft tissue mass along the inferior aspect of
the right posterior renal fascia lateral to the right psoas muscle.
2. Otherwise, no pathologically enlarged lymph nodes within the
chest, abdomen, or pelvis. No splenomegaly.
3. Linear opacity in the right upper lobe with associated
bronchiectasis/bronchiolectasis most likely reflects
sequela/scarring from prior infection/inflammation.
4. Moderate volume of formed stool throughout the colon suggestive
of constipation.
5.  Aortic Atherosclerosis ([id number].D).

## 2024-02-22 NOTE — Progress Notes (Signed)
 Cardiology Office Note    Date:  02/25/2024   ID:  Curtis Quinn, DOB June 12, 1941, MRN [id number]  PCP:  Auston Reyes BIRCH, MD  Cardiologist:  Evalene Lunger, MD  Electrophysiologist:  None   Chief Complaint: Follow-up  History of Present Illness:   Curtis Quinn is a 83 y.o. 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  He was previously evaluated for chest discomfort in 2015 with a stress test showing no infarct, though did reveal increased chronotropic response and a small area of ischemia.  Echo showed an EF of 50% with inferior wall motion abnormality.  ZIO monitor was performed in 2022 and demonstrated A-fib/flutter with a 3% burden.  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  At that time, he reported elevated heart rates while working in the garden and a sensation of feeling anxious.  He had not been taking metoprolol .  EKG showed sinus bradycardia with a rate of 58 bpm.  In the setting of Jehovah's Witness, he previously declined all anticoagulation.  Echo in 12/2022 showed an EF of 55 to 60%, no regional wall motion abnormalities, normal LV diastolic function parameters, normal RV systolic function and ventricular cavity size, normal RVSP, moderate mitral valve regurgitation with moderate late systolic prolapse of multiple scallops of the posterior leaflet of the mitral valve, mild to moderate tricuspid regurgitation, and aortic valve  sclerosis without evidence of stenosis.  Zio patch in 12/2022 showed a predominant rhythm of sinus with an average rate of 68 bpm with 1 pause occurring lasting 3 seconds with most pauses occurring overnight, secondary AV block type I, occasional PVCs with an overall burden of 4.8%, and rare PACs.  He was subsequently referred to pulmonology for consideration of a sleep study.  He comes in doing well from a cardiac perspective and is without symptoms of angina or cardiac decompensation.  He does intermittently note some palpitations and indicates his home BP cuff will intermittently tell him of an irregular heartbeat.  No dizziness, presyncope, or syncope.  No falls or symptoms concerning for bleeding.  Undergoing evaluation by neurology for recently diagnosed mild Alzheimer's disease.  He also indicates he has previously accompanied his wife, who also has A-fib, to EP evaluation for consideration of watchman in her case and reports he is not interested in moving forward with watchman either.  No significant lower extremity swelling or progressive orthopnea.  He does not have any acute cardiac concerns at this time.   Labs independently reviewed: 01/2024 - Hgb 12.2, PLT 173, potassium 3.9, BUN 25, serum creatinine 0.9, albumin 4.2, AST/ALT normal, TC 160, TG 55, HDL 47, LDL 101 10/2023 - TSH normal  Past Medical History:  Diagnosis Date   Arthritis    Cancer (HCC)    prostate   Epilepsy (HCC)    as a child only   Heart murmur    History of kidney stones    Hypercholesteremia    Hypertension    Shingles    Tremor     Past Surgical History:  Procedure Laterality Date  FRACTURE SURGERY     bil heels-plates and screws   HARDWARE REMOVAL Right    foot   HEEL SPUR SURGERY N/A    sarcoma remvoal leg Right 01/25/2022   [facility]   TONSILLECTOMY      Current Medications: Current Meds  Medication Sig   aspirin EC (ASPIRIN ADULT LOW DOSE) 81 MG tablet  (Patient taking differently: Take 81 mg by  mouth every other day.)   Cyanocobalamin (B-12 COMPLIANCE INJECTION) 1000 MCG/ML KIT Inject 1,000 mcg as directed every 30 (thirty) days.   metoprolol  tartrate (LOPRESSOR ) 25 MG tablet Take 25 mg by mouth as needed (high heart rate).   primidone (MYSOLINE) 50 MG tablet Take 50 mg by mouth in the morning and at bedtime.   simvastatin  (ZOCOR ) 20 MG tablet Take 1 tablet (20 mg total) by mouth at bedtime.   Vitamin D, Ergocalciferol, (DRISDOL) 1.25 MG (50000 UNIT) CAPS capsule Take 50,000 Units by mouth every 7 (seven) days.    Allergies:   Penicillins   Social History   Socioeconomic History   Marital status: Married    Spouse name: Not on file   Number of children: Not on file   Years of education: Not on file   Highest education level: Not on file  Occupational History   Not on file  Tobacco Use   Smoking status: Never   Smokeless tobacco: Never  Vaping Use   Vaping status: Never Used  Substance and Sexual Activity   Alcohol use: Yes    Comment: occ wine 2-3 times monthly   Drug use: Never   Sexual activity: Not on file  Other Topics Concern   Not on file  Social History Narrative   Moved from [place] from Jan 2022; lives in [place]- with wife. [ 5 children; son lives in [place] beach/grandson- [place]]. Never smoked; glass of wine- occasional. Worked for refrigeration/ managed lab.    Social Drivers of Corporate investment banker Strain: Low Risk  (11/14/2023)   Received from [facility]   Overall Financial Resource Strain (CARDIA)    Difficulty of Paying Living Expenses: Not hard at all  Food Insecurity: No Food Insecurity (11/14/2023)   Received from [facility]   Hunger Vital Sign    Within the past 12 months, you worried that your food would run out before you got the money to buy more.: Never true    Within the past 12 months, the food you bought just didn't last and you didn't have money to get more.: Never true  Transportation  Needs: No Transportation Needs (11/14/2023)   Received from [facility] - Transportation    In the past 12 months, has lack of transportation kept you from medical appointments or from getting medications?: No    Lack of Transportation (Non-Medical): No  Physical Activity: Insufficiently Active (11/24/2021)   Received from [facility]   Exercise Vital Sign    On average, how many days per week do you engage in moderate to strenuous exercise (like a brisk walk)?: 7 days    On average, how many minutes do you engage in exercise at this level?: 20 min  Stress: Not on file  Social Connections: Unknown (05/10/2022)   Received from [facility]   Family and [employer] Support    Help with Day-to-Day Activities: Not on file    Lonely or Isolated: Not on file     Family History:  The patient's family history includes Heart attack in his mother; Stroke in his father.  ROS:   12-point review of systems is negative unless otherwise noted in the HPI.   EKGs/Labs/Other Studies Reviewed:    Studies reviewed were summarized above. The additional studies were reviewed today:  Zio patch 12/2022: Normal sinus rhythm Patient had a min HR of 28 bpm, max HR of 125 bpm, and avg HR of 68 bpm.    1 Pause occurred lasting 3 secs (20 bpm).  Most pauses occurred overnight, consider sleep apnea second Degree AV Block-Mobitz I (Wenckebach) was present.    Isolated SVEs were rare (<1.0%), SVE Couplets were rare (<1.0%), and SVE Triplets were rare (<1.0%).  Isolated VEs were occasional (4.8%, 64625), VE Couplets were rare (<1.0%,502), and VE Triplets were rare (<1.0%, 6). Ventricular Bigeminy and Trigeminy were present.   No patient triggered events recorded __________  2D echo 01/16/2023: 1. Left ventricular ejection fraction, by estimation, is 55 to 60%. The  left ventricle has normal function. The left ventricle has no regional  wall motion  abnormalities. Left ventricular diastolic parameters were  normal.   2. Right ventricular systolic function is normal. The right ventricular  size is normal. There is normal pulmonary artery systolic pressure. The  estimated right ventricular systolic pressure is 33.0 mmHg.   3. The mitral valve is normal in structure. Moderate mitral valve  regurgitation. No evidence of mitral stenosis. There is moderate late  systolic prolapse of multiple scallops of the posterior leaflet of the  mitral valve.   4. Tricuspid valve regurgitation is mild to moderate.   5. The aortic valve is tricuspid. Aortic valve regurgitation is not  visualized. Aortic valve sclerosis is present, with no evidence of aortic  valve stenosis.   6. The inferior vena cava is dilated in size with >50% respiratory  variability, suggesting right atrial pressure of 8 mmHg.  __________  Zio patch 01/2021: Patient had a min HR of 29 bpm, max HR of 144 bpm, and avg HR of 70 bpm.  Predominant underlying rhythm was Sinus Rhythm.    3 Ventricular Tachycardia runs occurred, the run with the fastest interval lasting 4 beats with a max rate of 144 bpm, the longest  lasting 4 beats with an avg rate of 106 bpm.    3 Supraventricular Tachycardia runs occurred, the run with the fastest interval lasting 5 beats with a max rate of 125 bpm, the longest lasting 9 beats with an avg rate of 102 bpm.    Atrial Fibrillation occurred  (3% burden), ranging from 35-132 bpm (avg of 67 bpm), the longest lasting 8 hours 32 mins with an avg rate of 70 bpm.    Second Degree AV Block-Mobitz I (Wenckebach) was present.    Isolated SVEs were rare (<1.0%), SVE Couplets were rare (<1.0%), and SVE  Triplets were rare (<1.0%). Isolated VEs were occasional (4.6%, 63730), VE Couplets were rare (<1.0%, 4229), and VE Triplets were rare (<1.0%, 65). Ventricular Bigeminy and Trigeminy were present.    Patient triggered events not associated with significant  arrhythmia   EKG:  EKG is ordered today.  The EKG ordered today demonstrates sinus bradycardia, 56 bpm, first-degree AV block, no acute ST-T changes  Recent Labs: 05/22/2023: ALT 9; BUN 23; Creatinine 0.97; Hemoglobin 12.7; Platelet Count 189; Potassium 3.8; Sodium 138  Recent Lipid Panel No results found for: CHOL, TRIG, HDL, CHOLHDL, VLDL, LDLCALC, LDLDIRECT  PHYSICAL EXAM:    VS:  BP ROLLEN)  150/93 (BP Location: Left Arm, Patient Position: Sitting, Cuff Size: Normal)   Pulse 63   Resp 17   Ht 6' (1.829 m)   Wt 171 lb 6.4 oz (77.7 kg)   SpO2 97%   BMI 23.25 kg/m   BMI: Body mass index is 23.25 kg/m.  Physical Exam Vitals reviewed.  Constitutional:      Appearance: He is well-developed.  HENT:     Head: Normocephalic and atraumatic.  Eyes:     General:        Right eye: No discharge.        Left eye: No discharge.  Neck:     Vascular: No JVD.  Cardiovascular:     Rate and Rhythm: Normal rate and regular rhythm.     Heart sounds: S1 normal and S2 normal. Heart sounds not distant. No midsystolic click and no opening snap. Murmur heard.     Holosystolic murmur is present with a grade of 2/6 at the upper left sternal border and apex.     No friction rub.  Pulmonary:     Effort: Pulmonary effort is normal. No respiratory distress.     Breath sounds: Normal breath sounds. No decreased breath sounds, wheezing, rhonchi or rales.  Chest:     Chest wall: No tenderness.  Musculoskeletal:     Cervical back: Normal range of motion.     Right lower leg: No edema.     Left lower leg: No edema.  Skin:    General: Skin is warm and dry.     Nails: There is no clubbing.  Neurological:     Mental Status: He is alert and oriented to person, place, and time.  Psychiatric:        Speech: Speech normal.        Behavior: Behavior normal.        Thought Content: Thought content normal.        Judgment: Judgment normal.     Wt Readings from Last 3 Encounters:  02/25/24  171 lb 6.4 oz (77.7 kg)  11/20/23 158 lb (71.7 kg)  05/22/23 158 lb 3.2 oz (71.8 kg)     ASSESSMENT & PLAN:   PAF/flutter: Maintaining sinus rhythm with a mildly bradycardic rate and first-degree AV block, not on standing AV nodal blocking medication.  Continues with as needed Lopressor .  CHA2DS2-VASc at least 4 (HTN, age x 2, vascular disease) giving him an estimated annual stroke risk of 4.8% without OAC.  Has historically declined anticoagulation in the setting of Jehovah's Witness status.  He continues to decline anticoagulation today.  He also declines EP referral for discussion of Watchman.  He is aware of stroke risk.  Mitral regurgitation: Moderate by echo in 12/2022 with prolapse of posterior leaflet noted.  Declines echo at this time.  Will need echo at next visit.  HTN: Blood pressure is mildly elevated in the office today though typically well-controlled.  PVCs: 4.8% burden noted on prior Zio patch.  No evidence of ventricular ectopy on twelve-lead today.  Not currently requiring standing AV nodal blocking medication.  Aortic atherosclerosis/HLD: LDL 101 in 01/2024.  Remains on simvastatin .  Monitored by PCP.    Disposition: F/u with Dr. Gollan in 12 months.   Medication Adjustments/Labs and Tests Ordered: Current medicines are reviewed at length with the patient today.  Concerns regarding medicines are outlined above. Medication changes, Labs and Tests ordered today are summarized above and listed in the Patient Instructions accessible in Encounters.  Signed, Bernardino Bring, PA-C 02/25/2024 12:37 PM     [facility] [place] [address] [place], [place] [address] [phone]

## 2024-02-25 ENCOUNTER — Encounter: Payer: Self-pay | Admitting: Physician Assistant

## 2024-02-25 ENCOUNTER — Ambulatory Visit: Attending: Physician Assistant | Admitting: Physician Assistant

## 2024-02-25 VITALS — BP 150/93 | HR 63 | Resp 17 | Ht 72.0 in | Wt 171.4 lb

## 2024-02-25 DIAGNOSIS — I700 Atherosclerosis of aorta: Secondary | ICD-10-CM

## 2024-02-25 DIAGNOSIS — I340 Nonrheumatic mitral (valve) insufficiency: Secondary | ICD-10-CM

## 2024-02-25 DIAGNOSIS — I483 Typical atrial flutter: Secondary | ICD-10-CM | POA: Diagnosis not present

## 2024-02-25 DIAGNOSIS — I10 Essential (primary) hypertension: Secondary | ICD-10-CM | POA: Diagnosis not present

## 2024-02-25 DIAGNOSIS — I480 Paroxysmal atrial fibrillation: Secondary | ICD-10-CM

## 2024-02-25 DIAGNOSIS — E782 Mixed hyperlipidemia: Secondary | ICD-10-CM

## 2024-02-25 NOTE — Patient Instructions (Signed)
 Medication Instructions:  None ordered at this time  *If you need a refill on your cardiac medications before your next appointment, please call your pharmacy*  Lab Work: None ordered at this time  If you have labs (blood work) drawn today and your tests are completely normal, you will receive your results only by: MyChart Message (if you have MyChart) OR A paper copy in the mail If you have any lab test that is abnormal or we need to change your treatment, we will call you to review the results.  Testing/Procedures: None ordered at this time   Follow-Up: At [facility], you and your health needs are our priority.  As part of our continuing mission to provide you with exceptional heart care, our providers are all part of one team.  This team includes your primary Cardiologist (physician) and Advanced Practice Providers or APPs (Physician Assistants and Nurse Practitioners) who all work together to provide you with the care you need, when you need it.  Your next appointment:   12 month(s)  Provider:   You may see Timothy Gollan, MD

## 2024-03-06 ENCOUNTER — Other Ambulatory Visit: Payer: Self-pay

## 2024-03-06 ENCOUNTER — Other Ambulatory Visit: Payer: Self-pay | Admitting: Cardiovascular Disease
